# Patient Record
Sex: Male | Born: 1985 | Race: Black or African American | Hispanic: No | Marital: Single | Smoking: Current every day smoker
Health system: Southern US, Community
[De-identification: ages and names within clinical notes are randomized; demographics above are authoritative.]

## PROBLEM LIST (undated history)

## (undated) DIAGNOSIS — K219 Gastro-esophageal reflux disease without esophagitis: Secondary | ICD-10-CM

## (undated) DIAGNOSIS — R0602 Shortness of breath: Secondary | ICD-10-CM

## (undated) DIAGNOSIS — F319 Bipolar disorder, unspecified: Secondary | ICD-10-CM

## (undated) HISTORY — PX: APPENDECTOMY: SHX54

## (undated) HISTORY — PX: MANDIBLE SURGERY: SHX707

---
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
## 2003-02-15 ENCOUNTER — Encounter: Payer: Self-pay | Admitting: Emergency Medicine

## 2003-02-15 ENCOUNTER — Emergency Department (HOSPITAL_COMMUNITY): Admission: AD | Admit: 2003-02-15 | Discharge: 2003-02-15 | Payer: Self-pay | Admitting: Emergency Medicine

## 2003-03-13 ENCOUNTER — Emergency Department (HOSPITAL_COMMUNITY): Admission: EM | Admit: 2003-03-13 | Discharge: 2003-03-13 | Payer: Self-pay | Admitting: Emergency Medicine

## 2003-03-13 ENCOUNTER — Encounter: Payer: Self-pay | Admitting: Emergency Medicine

## 2003-05-20 ENCOUNTER — Encounter: Admission: RE | Admit: 2003-05-20 | Discharge: 2003-05-20 | Payer: Self-pay | Admitting: Family Medicine

## 2003-07-31 ENCOUNTER — Encounter: Admission: RE | Admit: 2003-07-31 | Discharge: 2003-07-31 | Payer: Self-pay | Admitting: Sports Medicine

## 2003-08-05 ENCOUNTER — Encounter: Admission: RE | Admit: 2003-08-05 | Discharge: 2003-08-05 | Payer: Self-pay | Admitting: Family Medicine

## 2003-08-12 ENCOUNTER — Encounter: Admission: RE | Admit: 2003-08-12 | Discharge: 2003-08-12 | Payer: Self-pay | Admitting: Family Medicine

## 2003-08-14 ENCOUNTER — Encounter: Admission: RE | Admit: 2003-08-14 | Discharge: 2003-08-14 | Payer: Self-pay | Admitting: Sports Medicine

## 2003-08-19 ENCOUNTER — Encounter: Admission: RE | Admit: 2003-08-19 | Discharge: 2003-08-19 | Payer: Self-pay | Admitting: Family Medicine

## 2003-09-02 ENCOUNTER — Encounter: Admission: RE | Admit: 2003-09-02 | Discharge: 2003-09-02 | Payer: Self-pay | Admitting: Family Medicine

## 2003-09-29 ENCOUNTER — Encounter: Admission: RE | Admit: 2003-09-29 | Discharge: 2003-09-29 | Payer: Self-pay | Admitting: Family Medicine

## 2003-10-14 ENCOUNTER — Encounter: Admission: RE | Admit: 2003-10-14 | Discharge: 2003-10-14 | Payer: Self-pay | Admitting: Family Medicine

## 2003-10-16 ENCOUNTER — Encounter: Admission: RE | Admit: 2003-10-16 | Discharge: 2003-10-16 | Payer: Self-pay | Admitting: Sports Medicine

## 2003-11-13 ENCOUNTER — Encounter: Admission: RE | Admit: 2003-11-13 | Discharge: 2003-11-13 | Payer: Self-pay | Admitting: Sports Medicine

## 2003-12-15 ENCOUNTER — Emergency Department (HOSPITAL_COMMUNITY): Admission: EM | Admit: 2003-12-15 | Discharge: 2003-12-15 | Payer: Self-pay | Admitting: Emergency Medicine

## 2004-06-26 ENCOUNTER — Emergency Department (HOSPITAL_COMMUNITY): Admission: EM | Admit: 2004-06-26 | Discharge: 2004-06-26 | Payer: Self-pay

## 2004-07-07 ENCOUNTER — Emergency Department (HOSPITAL_COMMUNITY): Admission: EM | Admit: 2004-07-07 | Discharge: 2004-07-08 | Payer: Self-pay | Admitting: Emergency Medicine

## 2004-07-21 ENCOUNTER — Encounter (INDEPENDENT_AMBULATORY_CARE_PROVIDER_SITE_OTHER): Payer: Self-pay | Admitting: *Deleted

## 2004-07-21 ENCOUNTER — Ambulatory Visit (HOSPITAL_COMMUNITY): Admission: RE | Admit: 2004-07-21 | Discharge: 2004-07-21 | Payer: Self-pay | Admitting: General Surgery

## 2004-08-30 ENCOUNTER — Emergency Department (HOSPITAL_COMMUNITY): Admission: EM | Admit: 2004-08-30 | Discharge: 2004-08-30 | Payer: Self-pay | Admitting: Family Medicine

## 2004-10-21 ENCOUNTER — Ambulatory Visit: Payer: Self-pay | Admitting: Sports Medicine

## 2005-02-17 ENCOUNTER — Ambulatory Visit: Payer: Self-pay | Admitting: Sports Medicine

## 2005-04-06 ENCOUNTER — Ambulatory Visit: Payer: Self-pay | Admitting: Family Medicine

## 2005-05-18 ENCOUNTER — Ambulatory Visit: Payer: Self-pay | Admitting: Family Medicine

## 2005-09-07 ENCOUNTER — Emergency Department (HOSPITAL_COMMUNITY): Admission: EM | Admit: 2005-09-07 | Discharge: 2005-09-07 | Payer: Self-pay | Admitting: Emergency Medicine

## 2005-09-10 ENCOUNTER — Emergency Department (HOSPITAL_COMMUNITY): Admission: EM | Admit: 2005-09-10 | Discharge: 2005-09-10 | Payer: Self-pay | Admitting: Family Medicine

## 2005-09-22 ENCOUNTER — Ambulatory Visit: Payer: Self-pay | Admitting: Sports Medicine

## 2005-10-18 ENCOUNTER — Ambulatory Visit: Payer: Self-pay | Admitting: Family Medicine

## 2005-11-02 ENCOUNTER — Ambulatory Visit: Payer: Self-pay | Admitting: Sports Medicine

## 2005-12-19 ENCOUNTER — Emergency Department (HOSPITAL_COMMUNITY): Admission: EM | Admit: 2005-12-19 | Discharge: 2005-12-19 | Payer: Self-pay | Admitting: Emergency Medicine

## 2005-12-21 ENCOUNTER — Ambulatory Visit: Payer: Self-pay | Admitting: Sports Medicine

## 2006-03-21 ENCOUNTER — Emergency Department (HOSPITAL_COMMUNITY): Admission: EM | Admit: 2006-03-21 | Discharge: 2006-03-22 | Payer: Self-pay | Admitting: Emergency Medicine

## 2006-03-22 ENCOUNTER — Ambulatory Visit: Payer: Self-pay | Admitting: Sports Medicine

## 2006-03-29 ENCOUNTER — Ambulatory Visit: Payer: Self-pay | Admitting: Sports Medicine

## 2006-04-12 ENCOUNTER — Ambulatory Visit: Payer: Self-pay | Admitting: Sports Medicine

## 2006-04-18 ENCOUNTER — Ambulatory Visit: Payer: Self-pay | Admitting: Sports Medicine

## 2006-04-24 ENCOUNTER — Inpatient Hospital Stay (HOSPITAL_COMMUNITY): Admission: AD | Admit: 2006-04-24 | Discharge: 2006-04-25 | Payer: Self-pay | Admitting: *Deleted

## 2006-04-24 ENCOUNTER — Ambulatory Visit: Payer: Self-pay | Admitting: *Deleted

## 2006-04-27 ENCOUNTER — Ambulatory Visit: Payer: Self-pay | Admitting: Sports Medicine

## 2006-06-16 ENCOUNTER — Emergency Department (HOSPITAL_COMMUNITY): Admission: EM | Admit: 2006-06-16 | Discharge: 2006-06-16 | Payer: Self-pay | Admitting: Family Medicine

## 2006-07-14 ENCOUNTER — Emergency Department (HOSPITAL_COMMUNITY): Admission: EM | Admit: 2006-07-14 | Discharge: 2006-07-14 | Payer: Self-pay | Admitting: Emergency Medicine

## 2006-08-29 ENCOUNTER — Ambulatory Visit: Payer: Self-pay | Admitting: Family Medicine

## 2006-08-31 ENCOUNTER — Emergency Department (HOSPITAL_COMMUNITY): Admission: EM | Admit: 2006-08-31 | Discharge: 2006-08-31 | Payer: Self-pay | Admitting: Family Medicine

## 2006-09-06 ENCOUNTER — Emergency Department (HOSPITAL_COMMUNITY): Admission: EM | Admit: 2006-09-06 | Discharge: 2006-09-07 | Payer: Self-pay | Admitting: Emergency Medicine

## 2006-09-07 ENCOUNTER — Ambulatory Visit: Payer: Self-pay | Admitting: Sports Medicine

## 2006-12-24 ENCOUNTER — Emergency Department (HOSPITAL_COMMUNITY): Admission: EM | Admit: 2006-12-24 | Discharge: 2006-12-24 | Payer: Self-pay | Admitting: Emergency Medicine

## 2006-12-29 ENCOUNTER — Telehealth: Payer: Self-pay | Admitting: *Deleted

## 2007-01-01 ENCOUNTER — Ambulatory Visit: Payer: Self-pay | Admitting: Family Medicine

## 2007-01-10 ENCOUNTER — Ambulatory Visit: Payer: Self-pay | Admitting: Sports Medicine

## 2007-04-05 ENCOUNTER — Ambulatory Visit: Payer: Self-pay | Admitting: Family Medicine

## 2007-04-05 DIAGNOSIS — F319 Bipolar disorder, unspecified: Secondary | ICD-10-CM | POA: Insufficient documentation

## 2007-04-08 ENCOUNTER — Emergency Department (HOSPITAL_COMMUNITY): Admission: EM | Admit: 2007-04-08 | Discharge: 2007-04-08 | Payer: Self-pay | Admitting: Emergency Medicine

## 2007-04-12 ENCOUNTER — Ambulatory Visit: Payer: Self-pay | Admitting: Sports Medicine

## 2007-04-17 ENCOUNTER — Ambulatory Visit: Payer: Self-pay | Admitting: Sports Medicine

## 2007-04-17 ENCOUNTER — Ambulatory Visit (HOSPITAL_COMMUNITY): Admission: RE | Admit: 2007-04-17 | Discharge: 2007-04-17 | Payer: Self-pay | Admitting: Sports Medicine

## 2007-04-18 ENCOUNTER — Encounter (INDEPENDENT_AMBULATORY_CARE_PROVIDER_SITE_OTHER): Payer: Self-pay | Admitting: *Deleted

## 2007-04-25 ENCOUNTER — Ambulatory Visit: Payer: Self-pay | Admitting: Sports Medicine

## 2007-05-09 ENCOUNTER — Ambulatory Visit: Payer: Self-pay | Admitting: Sports Medicine

## 2007-05-09 ENCOUNTER — Encounter (INDEPENDENT_AMBULATORY_CARE_PROVIDER_SITE_OTHER): Payer: Self-pay | Admitting: *Deleted

## 2007-05-25 ENCOUNTER — Encounter: Payer: Self-pay | Admitting: Sports Medicine

## 2007-06-08 ENCOUNTER — Emergency Department (HOSPITAL_COMMUNITY): Admission: EM | Admit: 2007-06-08 | Discharge: 2007-06-08 | Payer: Self-pay | Admitting: Emergency Medicine

## 2007-06-26 ENCOUNTER — Emergency Department (HOSPITAL_COMMUNITY): Admission: EM | Admit: 2007-06-26 | Discharge: 2007-06-27 | Payer: Self-pay | Admitting: Emergency Medicine

## 2007-08-08 ENCOUNTER — Encounter: Payer: Self-pay | Admitting: Sports Medicine

## 2007-08-30 ENCOUNTER — Encounter (INDEPENDENT_AMBULATORY_CARE_PROVIDER_SITE_OTHER): Payer: Self-pay | Admitting: *Deleted

## 2007-08-30 ENCOUNTER — Ambulatory Visit: Payer: Self-pay | Admitting: Family Medicine

## 2007-08-30 LAB — CONVERTED CEMR LAB
ALT: 23 units/L (ref 0–53)
CO2: 27 meq/L (ref 19–32)
Creatinine, Ser: 1.26 mg/dL (ref 0.40–1.50)
H Pylori IgG: NEGATIVE
Total Bilirubin: 0.6 mg/dL (ref 0.3–1.2)

## 2007-09-03 ENCOUNTER — Encounter (INDEPENDENT_AMBULATORY_CARE_PROVIDER_SITE_OTHER): Payer: Self-pay | Admitting: *Deleted

## 2007-09-04 ENCOUNTER — Ambulatory Visit (HOSPITAL_COMMUNITY): Admission: RE | Admit: 2007-09-04 | Discharge: 2007-09-04 | Payer: Self-pay | Admitting: *Deleted

## 2007-09-04 ENCOUNTER — Encounter (INDEPENDENT_AMBULATORY_CARE_PROVIDER_SITE_OTHER): Payer: Self-pay | Admitting: *Deleted

## 2007-09-07 ENCOUNTER — Emergency Department (HOSPITAL_COMMUNITY): Admission: EM | Admit: 2007-09-07 | Discharge: 2007-09-07 | Payer: Self-pay | Admitting: Emergency Medicine

## 2007-09-17 ENCOUNTER — Telehealth: Payer: Self-pay | Admitting: *Deleted

## 2007-09-20 ENCOUNTER — Encounter: Payer: Self-pay | Admitting: *Deleted

## 2007-09-28 ENCOUNTER — Emergency Department (HOSPITAL_COMMUNITY): Admission: EM | Admit: 2007-09-28 | Discharge: 2007-09-28 | Payer: Self-pay | Admitting: Emergency Medicine

## 2008-04-09 ENCOUNTER — Emergency Department (HOSPITAL_COMMUNITY): Admission: EM | Admit: 2008-04-09 | Discharge: 2008-04-10 | Payer: Self-pay | Admitting: Emergency Medicine

## 2008-04-11 ENCOUNTER — Telehealth (INDEPENDENT_AMBULATORY_CARE_PROVIDER_SITE_OTHER): Payer: Self-pay | Admitting: *Deleted

## 2008-04-12 ENCOUNTER — Emergency Department (HOSPITAL_COMMUNITY): Admission: EM | Admit: 2008-04-12 | Discharge: 2008-04-12 | Payer: Self-pay | Admitting: Emergency Medicine

## 2008-04-14 ENCOUNTER — Ambulatory Visit: Payer: Self-pay | Admitting: Family Medicine

## 2008-08-04 ENCOUNTER — Ambulatory Visit: Payer: Self-pay | Admitting: Family Medicine

## 2008-08-04 ENCOUNTER — Telehealth: Payer: Self-pay | Admitting: *Deleted

## 2008-08-10 ENCOUNTER — Emergency Department (HOSPITAL_COMMUNITY): Admission: EM | Admit: 2008-08-10 | Discharge: 2008-08-10 | Payer: Self-pay | Admitting: Emergency Medicine

## 2008-08-19 ENCOUNTER — Telehealth: Payer: Self-pay | Admitting: Family Medicine
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
## 2009-05-09 ENCOUNTER — Encounter (INDEPENDENT_AMBULATORY_CARE_PROVIDER_SITE_OTHER): Payer: Self-pay | Admitting: *Deleted

## 2009-05-09 DIAGNOSIS — F17200 Nicotine dependence, unspecified, uncomplicated: Secondary | ICD-10-CM

## 2009-08-31 ENCOUNTER — Emergency Department (HOSPITAL_COMMUNITY): Admission: EM | Admit: 2009-08-31 | Discharge: 2009-09-01 | Payer: Self-pay | Admitting: Emergency Medicine

## 2009-08-31 ENCOUNTER — Emergency Department (HOSPITAL_COMMUNITY): Admission: EM | Admit: 2009-08-31 | Discharge: 2009-08-31 | Payer: Self-pay | Admitting: Emergency Medicine

## 2009-09-01 ENCOUNTER — Ambulatory Visit: Payer: Self-pay | Admitting: Family Medicine

## 2009-09-13 ENCOUNTER — Emergency Department (HOSPITAL_COMMUNITY): Admission: EM | Admit: 2009-09-13 | Discharge: 2009-09-14 | Payer: Self-pay | Admitting: Emergency Medicine

## 2010-01-25 ENCOUNTER — Telehealth: Payer: Self-pay | Admitting: Family Medicine

## 2010-01-26 ENCOUNTER — Ambulatory Visit: Payer: Self-pay | Admitting: Family Medicine

## 2010-02-05 ENCOUNTER — Ambulatory Visit: Payer: Self-pay | Admitting: Family Medicine

## 2010-02-09 ENCOUNTER — Telehealth: Payer: Self-pay | Admitting: *Deleted

## 2010-04-15 ENCOUNTER — Telehealth: Payer: Self-pay | Admitting: Family Medicine

## 2010-04-16 ENCOUNTER — Ambulatory Visit: Payer: Self-pay | Admitting: Family Medicine

## 2010-04-16 DIAGNOSIS — M545 Low back pain: Secondary | ICD-10-CM

## 2010-04-19 ENCOUNTER — Encounter: Payer: Self-pay | Admitting: Family Medicine

## 2010-04-19 DIAGNOSIS — J45909 Unspecified asthma, uncomplicated: Secondary | ICD-10-CM | POA: Insufficient documentation

## 2010-06-01 ENCOUNTER — Ambulatory Visit: Payer: Self-pay | Admitting: Family Medicine

## 2010-07-08 ENCOUNTER — Emergency Department (HOSPITAL_COMMUNITY): Admission: EM | Admit: 2010-07-08 | Discharge: 2010-01-24 | Payer: Self-pay | Admitting: Emergency Medicine

## 2010-08-11 ENCOUNTER — Emergency Department (HOSPITAL_COMMUNITY)
Admission: EM | Admit: 2010-08-11 | Discharge: 2010-08-11 | Payer: Self-pay | Source: Home / Self Care | Admitting: Emergency Medicine

## 2010-08-13 ENCOUNTER — Ambulatory Visit
Admission: RE | Admit: 2010-08-13 | Discharge: 2010-08-13 | Payer: Self-pay | Source: Home / Self Care | Attending: Family Medicine | Admitting: Family Medicine

## 2010-08-13 DIAGNOSIS — S93409A Sprain of unspecified ligament of unspecified ankle, initial encounter: Secondary | ICD-10-CM | POA: Insufficient documentation

## 2010-08-22 ENCOUNTER — Encounter: Payer: Self-pay | Admitting: Sports Medicine

## 2010-08-31 NOTE — Assessment & Plan Note (Signed)
Summary: would not say why he needs to be seen/[facility]   Vital Signs:  Patient profile:   25 year old male Weight:      221 pounds Pulse rate:   84 / minute BP sitting:   137 / 90  (left arm) Cuff size:   large  Vitals Entered By: Arlyss Repress CMA, (April 16, 2010 9:39 AM) CC: back pain. tight and muscle spasms x 2 days. received std tx at the HD yesterday. ? rocephin shot? one sexual partner was dx with std at the HD and he went there for tx Is Patient Diabetic? No Pain Assessment Patient in pain? yes     Location: lower back Intensity: 10 Onset of pain  x2d   Primary Care Provider:  Enid Baas MD  CC:  back pain. tight and muscle spasms x 2 days. received std tx at the HD yesterday. ? rocephin shot? one sexual partner was dx with std at the HD and he went there for tx.  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 Yesterday became severe and unable to sleep.  He was also upset yesterday because a young lady who he was having sex with called and told him she had GC. He was treated at the HD for all STDs and HIV obtained.  Habits & Providers  Alcohol-Tobacco-Diet     Tobacco Status: current     Tobacco Counseling: to remain off tobacco products     Cigarette Packs/Day: 0.5  Comments: pt has interest in quitting  Current Medications (verified): 1)  Diazepam 5 Mg Tabs (Diazepam) .... One Two Times A Day As Needed 2)  Ventolin Hfa 108 (90 Base) Mcg/act Aers (Albuterol Sulfate) .... Two Puffs Q 4 Hours As Needed For Cough and Wheeze 3)  Hydrocodone-Acetaminophen 5-500 Mg Tabs (Hydrocodone-Acetaminophen) .... One Tab Three Times A Day As Needed Pain 4)  Omeprazole 40 Mg Cpdr (Omeprazole) .... One Daily in Morning Before Eating On An Empty Stomach. 5)  Diclofenac Sodium 75 Mg Tbec (Diclofenac Sodium) .... Two Times A Day For 10 Days  Allergies (verified): 1)  ! Ibuprofen (Ibuprofen)  Social History: Packs/Day:   0.5  Review of Systems General:  Denies chills and fever. MS:  Complains of low back pain.  Physical Exam  General:  Well-developed,well-nourished,in no acute distress; alert,appropriate and cooperative throughout examination Msk:  flat lumbar lordosis, tender along the lumbar paravetebral muscles, good extension, pain with fexion.  LE strenght 5/5 with toe and heel walking.  Neurologic:   LE DTRs +2 symmetrical Psych:  normally interactive.     Impression & Recommendations:  Problem # 1:  LOW BACK PAIN, ACUTE (ICD-724.2) Short course of pain meds, low back exercises and discussion regarding body mechanics. His updated medication list for this problem includes:    Hydrocodone-acetaminophen 5-500 Mg Tabs (Hydrocodone-acetaminophen) ..... One tab three times a day as needed pain    Diclofenac Sodium 75 Mg Tbec (Diclofenac sodium) .Marland Kitchen..Marland Kitchen Two times a day for 10 days  Orders: [facility]- Est Level  3 (09811)  Complete Medication List: 1)  Diazepam 5 Mg Tabs (Diazepam) .... One two times a day as needed 2)  Ventolin Hfa 108 (90 Base) Mcg/act Aers (Albuterol sulfate) .... Two puffs q 4 hours as needed for cough and wheeze 3)  Hydrocodone-acetaminophen 5-500 Mg Tabs (Hydrocodone-acetaminophen) .... One tab three times a day as needed pain 4)  Omeprazole 40 Mg Cpdr (Omeprazole) .... One daily  in morning before eating on an empty stomach. 5)  Diclofenac Sodium 75 Mg Tbec (Diclofenac sodium) .... Two times a day for 10 days  Patient Instructions: 1)  If you could be exposed to sexually transmitted diseases. you should use a condom.  2)  So back exercises daily Prescriptions: DICLOFENAC SODIUM 75 MG TBEC (DICLOFENAC SODIUM) two times a day for 10 days Brand medically necessary #20 x 0   Entered and Authorized by:   Luretha Murphy NP   Signed by:   Luretha Murphy NP on 04/16/2010   Method used:   Print then Give to Patient   RxID:   [id number] HYDROCODONE-ACETAMINOPHEN 5-500 MG TABS  (HYDROCODONE-ACETAMINOPHEN) one tab three times a day as needed pain Brand medically necessary #30 x 0   Entered and Authorized by:   Luretha Murphy NP   Signed by:   Luretha Murphy NP on 04/16/2010   Method used:   Print then Give to Patient   RxID:   [id number] DIAZEPAM 5 MG TABS (DIAZEPAM) one two times a day as needed Brand medically necessary #60 x 0   Entered and Authorized by:   Luretha Murphy NP   Signed by:   Luretha Murphy NP on 04/16/2010   Method used:   Print then Give to Patient   RxID:   [id number] DICLOFENAC SODIUM 75 MG TBEC (DICLOFENAC SODIUM) two times a day for 10 days Brand medically necessary #20 x 0   Entered and Authorized by:   Luretha Murphy NP   Signed by:   Luretha Murphy NP on 04/16/2010   Method used:   Print then Give to Patient   RxID:   [id number] HYDROCODONE-ACETAMINOPHEN 5-500 MG TABS (HYDROCODONE-ACETAMINOPHEN) one tab three times a day as needed pain Brand medically necessary #30 x 0   Entered and Authorized by:   Luretha Murphy NP   Signed by:   Luretha Murphy NP on 04/16/2010   Method used:   Print then Give to Patient   RxID:   [id number] DIAZEPAM 5 MG TABS (DIAZEPAM) one two times a day as needed Brand medically necessary #60 x 0   Entered and Authorized by:   Luretha Murphy NP   Signed by:   Luretha Murphy NP on 04/16/2010   Method used:   Print then Give to Patient   RxID:   [id number] DICLOFENAC SODIUM 75 MG TBEC (DICLOFENAC SODIUM) two times a day for 10 days Brand medically necessary #20 x 0   Entered and Authorized by:   Luretha Murphy NP   Signed by:   Luretha Murphy NP on 04/16/2010   Method used:   Print then Give to Patient   RxID:   [id number] DIAZEPAM 5 MG TABS (DIAZEPAM) one two times a day as needed Brand medically necessary #60 x 0   Entered and Authorized by:   Luretha Murphy NP   Signed by:   Luretha Murphy NP on 04/16/2010   Method used:   Print then Give to Patient   RxID:   [id number] HYDROCODONE-ACETAMINOPHEN 5-500 MG  TABS (HYDROCODONE-ACETAMINOPHEN) one tab three times a day as needed pain Brand medically necessary #30 x 0   Entered and Authorized by:   Luretha Murphy NP   Signed by:   Luretha Murphy NP on 04/16/2010   Method used:   Print then Give to Patient   RxID:   [id number]

## 2010-08-31 NOTE — Progress Notes (Signed)
       Additional Follow-up for Phone Call Additional follow up Details #2::    he called md line & LM. he wants to see his pcp only & would prefer to speak with her only. appt made for tomorrow Follow-up by: Golden Circle RN,  April 15, 2010 11:26 AM

## 2010-08-31 NOTE — Progress Notes (Signed)
Summary: Lost Rx  Phone Note Call from Patient Call back at [phone]   Reason for Call: Talk to Doctor Summary of Call: pt was here 7/8, has not filled meds yet bc he didn't have the money, he lost the Rxs wants to know if we can replace them? Initial call taken by: Knox Royalty,  February 09, 2010 3:36 PM  Follow-up for Phone Call        called pt. pt found his rx's. Follow-up by: Arlyss Repress CMA,,  February 09, 2010 3:43 PM
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
## 2010-08-31 NOTE — Progress Notes (Signed)
Summary: triage  Phone Note Call from Patient Call back at [phone]   Caller: Patient Summary of Call: Hurt ankle over weekend and wants to be seen today. Initial call taken by: Clydell Hakim,  January 25, 2010 8:51 AM  Follow-up for Phone Call        LM Follow-up by: Golden Circle RN,  January 25, 2010 9:06 AM  Additional Follow-up for Phone Call Additional follow up Details #1::        spoke with a woman who wanted to make the appt for him as he was not there. states he twisted his ankle badly & they went to ED. was told to f/u here. appt tomorrow at 10am with Dr. Claudius Sis Additional Follow-up by: Golden Circle RN,  January 25, 2010 9:37 AM

## 2010-08-31 NOTE — Assessment & Plan Note (Signed)
Summary: f/u ankle injury/[name]/saxon   Vital Signs:  Patient profile:   25 year old male Weight:      226 pounds Temp:     98.6 degrees F Pulse rate:   74 / minute BP sitting:   125 / 68  Vitals Entered By: Jone Baseman CMA (January 26, 2010 10:27 AM) CC: f/u ankle Is Patient Diabetic? No Pain Assessment Patient in pain? yes     Location: keft ankle Intensity: 6   Primary Care Provider:  Enid Baas MD  CC:  f/u ankle.  History of Present Illness: Patient was recently seen at [facility] on 6/26 for c/o twisting his L ankle 3 days ago. Xray was negative. He was walking in his yard and stepped in a hole and inverted his ankle.  He was given an Rx for Vicodin but states it is not helping.  He has been wearing a brace on it and walking with a crutch.  The bruising is minimal, mostly confined to lateral foot, inferior to lateral malleolus.  There is no point tenderness, he has full ROM and can bear weight.  He also c/o mild R ankle pain from a previous injury.  However, he still stays active and plays basketball regularly.      Habits & Providers  Alcohol-Tobacco-Diet     Tobacco Status: current     Tobacco Counseling: to quit use of tobacco products     Cigarette Packs/Day: 0.75  Allergies: No Known Drug Allergies  Social History: Packs/Day:  0.75  Physical Exam  General:  Well-developed,well-nourished,in no acute distress; alert,appropriate and cooperative throughout examination Extremities:  L ankle with swelling and ecchymosis (lateral heel), mostly surrounding lateral malleolus.  Full ROM, weight bearing, no bony point tenderness. Passive inversion and plantar flexion reproduces pain. No apparent ligamentous instability. DP and PT pulses euqal bilaterally.    Impression & Recommendations:  Problem # 1:  ANKLE SPRAIN, LEFT (ICD-845.00) Vicodin not helping.  Will give short course of Percocet.  Patient allergic to Ibuprofen.  Continue RICE treatment (written info provided).   If no improvement in 1 week - make an appointment with Sports Medicine clinic.   His updated medication list for this problem includes:    Percocet 5-325 Mg Tabs (Oxycodone-acetaminophen) .Marland Kitchen..Marland Kitchen Two tabs by mouth q4 hours as needed for pain  Orders: [facility]- Est Level  3 (04540)  Complete Medication List: 1)  Diazepam 5 Mg Tabs (Diazepam) .... One two times a day as needed 2)  Flonase 50 Mcg/act Susp (Fluticasone propionate) .... Two squirts in each nostril daily 3)  Ventolin Hfa 108 (90 Base) Mcg/act Aers (Albuterol sulfate) .... Two puffs q 4 hours as needed for cough and wheeze 4)  Percocet 5-325 Mg Tabs (Oxycodone-acetaminophen) .... Two tabs by mouth q4 hours as needed for pain Prescriptions: PERCOCET 5-325 MG TABS (OXYCODONE-ACETAMINOPHEN) two tabs by mouth q4 hours as needed for pain  #30 x 0   Entered and Authorized by:   Jettie Pagan MD   Signed by:   Jettie Pagan MD on 01/26/2010   Method used:   Print then Give to Patient   RxID:   [id number]

## 2010-08-31 NOTE — Assessment & Plan Note (Signed)
Summary: back pain/ha/kh   Vital Signs:  Patient profile:   25 year old male Weight:      230.8 pounds Pulse rate:   88 / minute BP sitting:   137 / 90  (left arm) Cuff size:   large  Vitals Entered By: Arlyss Repress CMA, (June 01, 2010 8:40 AM) CC: f/up back pain. refill valium. Is Patient Diabetic? No Pain Assessment Patient in pain? yes     Location: lower back Intensity: 7 Onset of pain  x 1 week   Primary Care Provider:  Enid Baas MD  CC:  f/up back pain. refill valium.Marland Kitchen  History of Present Illness: Peter Bailey is here with his grandmother.  He fell when trying grab is daugther and re-injured his back.  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  His family is encouraging him to go to school as he is good in Optician, dispensing.  Never got his diazepam script filled per grandmother, she found the script at home.  It cost $14 at [facility] and does not have money.  This helps his anger, he has an apt at the [facility] in December.  Has been on and off a lot of med, diazepam seems to calm him down the best.  Lower back pain with spasms, he says that the knows yoga, encouraged him to practice to control chronic back problems.  Habits & Providers  Alcohol-Tobacco-Diet     Tobacco Status: current     Tobacco Counseling: to quit use of tobacco products     Cigarette Packs/Day: 0.25  Current Medications (verified): 1)  Diazepam 5 Mg Tabs (Diazepam) .... One Three Times A Day As Needed 2)  Ventolin Hfa 108 (90 Base) Mcg/act Aers (Albuterol Sulfate) .... Two Puffs Q 4 Hours As Needed For Cough and Wheeze 3)  Hydrocodone-Acetaminophen 5-500 Mg Tabs (Hydrocodone-Acetaminophen) .... One Tab Three Times A Day As Needed Pain 4)  Omeprazole 40 Mg Cpdr (Omeprazole) .... One Daily in Morning Before Eating On An Empty Stomach.  Allergies (verified): 1)  ! Ibuprofen (Ibuprofen)  Social History: Packs/Day:  0.25  Review of Systems      See HPI MS:  Complains of low back pain;  denies muscle weakness.  Physical Exam  General:  Well-developed,well-nourished,in no acute distress; alert,appropriate and cooperative throughout examination Chest Wall:  tender along rib margins on the right with puncture site healing. Lungs:  normal respiratory effort, normal breath sounds, and no wheezes.   Heart:  normal rate and regular rhythm.   Msk:  flat lumbar lordosis, walks with buttocks behind him.  Good ROM of lumbar spine but complains of pain.  LE 5/5, excellent muscle tone, able to walk on toes and heels.   Impression & Recommendations:  Problem # 1:  LOW BACK PAIN, ACUTE (ICD-724.2)  appears to reinjure himself over and over, recommended doing yoga and walking with more of a pelvic tilt.  Refilled hydrocodone for one per day. The following medications were removed from the medication list:    Diclofenac Sodium 75 Mg Tbec (Diclofenac sodium) .Marland Kitchen..Marland Kitchen Two times a day for 10 days His updated medication list for this problem includes:    Hydrocodone-acetaminophen 5-500 Mg Tabs (Hydrocodone-acetaminophen) ..... One tab three times a day as needed pain  Orders: [facility]- Est Level  3 (04540)  Problem # 2:  DISORDER, BIPOLAR NOS (ICD-296.80)  [facility], never followed up on in house psych referral.  Refilled diazepam, has been prescribed for years  for anger.  Cautioned not to use with alcohol.  Gransmother took the scripts and will manage.  Orders: [facility]- Est Level  3 (99213)  Problem # 3:  ASTHMA, INTERMITTENT, MILD (ICD-493.90)  His updated medication list for this problem includes:    Ventolin Hfa 108 (90 Base) Mcg/act Aers (Albuterol sulfate) .Marland Kitchen..Marland Kitchen Two puffs q 4 hours as needed for cough and wheeze  Orders: [facility]- Est Level  3 (65784)  Complete Medication List: 1)  Diazepam 5 Mg Tabs (Diazepam) .... One three times a day as needed 2)  Ventolin Hfa 108 (90 Base) Mcg/act Aers (Albuterol sulfate) .... Two puffs q 4 hours as needed for cough and  wheeze 3)  Hydrocodone-acetaminophen 5-500 Mg Tabs (Hydrocodone-acetaminophen) .... One tab three times a day as needed pain  Other Orders: Influenza Vaccine NON MCR (69629)  Patient Instructions: 1)  Think about going back to school, time is going by 2)  Stretch your back daily 3)  Only use the diazepam when you need it for anger 4)  Hydrocodone one per day, will refill for 2 months then you have to be seen Prescriptions: HYDROCODONE-ACETAMINOPHEN 5-500 MG TABS (HYDROCODONE-ACETAMINOPHEN) one tab three times a day as needed pain Brand medically necessary #30 x 0   Entered and Authorized by:   Luretha Murphy NP   Signed by:   Luretha Murphy NP on 06/01/2010   Method used:   Print then Give to Patient   RxID:   [id number] VENTOLIN HFA 108 (90 BASE) MCG/ACT AERS (ALBUTEROL SULFATE) two puffs q 4 hours as needed for cough and wheeze Brand medically necessary #1 x 6   Entered and Authorized by:   Luretha Murphy NP   Signed by:   Luretha Murphy NP on 06/01/2010   Method used:   Print then Give to Patient   RxID:   [id number] DIAZEPAM 5 MG TABS (DIAZEPAM) one three times a day as needed Brand medically necessary #90 x 0   Entered and Authorized by:   Luretha Murphy NP   Signed by:   Luretha Murphy NP on 06/01/2010   Method used:   Print then Give to Patient   RxID:   [id number]    Orders Added: 1)  [facility]- Est Level  3 [60630] 2)  Influenza Vaccine NON MCR [00028]   Immunizations Administered:  Influenza Vaccine # 1:    Vaccine Type: Fluvax Non-MCR    Site: left deltoid    Mfr: GlaxoSmithKline    Dose: 0.5 ml    Route: IM    Given by: Arlyss Repress CMA,    Exp. Date: 01/26/2011    Lot #: [id number]    VIS given: 02/23/10 version given June 01, 2010.  Flu Vaccine Consent Questions:    Do you have a history of severe allergic reactions to this vaccine? no    Any prior history of allergic reactions to egg and/or gelatin? no    Do you have a sensitivity to the preservative  Thimersol? no    Do you have a past history of Guillan-Barre Syndrome? no    Do you currently have an acute febrile illness? no    Have you ever had a severe reaction to latex? no    Vaccine information given and explained to patient? yes   Immunizations Administered:  Influenza Vaccine # 1:    Vaccine Type: Fluvax Non-MCR    Site: left deltoid    Mfr: GlaxoSmithKline    Dose: 0.5 ml    Route:  IM    Given by: Arlyss Repress CMA,    Exp. Date: 01/26/2011    Lot #: [id number]    VIS given: 02/23/10 version given June 01, 2010.

## 2010-08-31 NOTE — Miscellaneous (Signed)
  Clinical Lists Changes  Problems: Removed problem of ASTHMA, UNSPECIFIED (ICD-493.90) Added new problem of ASTHMA, INTERMITTENT, MILD (ICD-493.90)

## 2010-08-31 NOTE — Assessment & Plan Note (Signed)
Summary: f/up from ed visit for bronchitis,tcb   Vital Signs:  Patient profile:   25 year old male Weight:      219 pounds Temp:     98 degrees F oral Pulse rate:   99 / minute BP sitting:   134 / 86  (right arm)  Vitals Entered By: Renato Battles slade,cma CC: body aches. seen at ED. diagnosed with bronchitis. c/o body aches. chills, fever, fatigue. did not have flu shot this year Is Patient Diabetic? No Pain Assessment Patient in pain? yes     Location: body Intensity: 10 Onset of pain  x 4-5 days.   Primary Care Provider:  Enid Baas MD  CC:  body aches. seen at ED. diagnosed with bronchitis. c/o body aches. chills, fever, and fatigue. did not have flu shot this year.  History of Present Illness: Went to ER last night, diagnosed with flu and asthma exacerbation.  Given narcotic injection for chest pain, and albuterol nebs.  Scripts for narcotic cough syrup, prednisone burst, and Tamiflu have not been  filled, as he did not have certification through [employer] and not enough funds.  Explained prednisone was not expensive.  Has not been using his asthma meds regualry.  Reports constant sinus drainage and congestion.  Would like Diazepam refilled, reported that Dr. Darrick Penna took him off Depakote and prescribed DIazepam for this anger and outbursts.  His finance was with him today.  Current Medications (verified): 1)  Diazepam 5 Mg Tabs (Diazepam) .... One Two Times A Day As Needed 2)  Flonase 50 Mcg/act Susp (Fluticasone Propionate) .... Two Squirts in Each Nostril Daily 3)  Ventolin Hfa 108 (90 Base) Mcg/act Aers (Albuterol Sulfate) .... Two Puffs Q 4 Hours As Needed For Cough and Wheeze  Allergies (verified): No Known Drug Allergies  Physical Exam  General:  Look uncomfortable Ears:  TM retracted Nose:  congested Lungs:  normal respiratory effort and normal breath sounds.   Heart:  normal rate and regular rhythm.     Impression & Recommendations:  Problem # 1:   INFLUENZA (ICD-487.8)  Discussed pros and cons of Tamiflu  Orders: [facility]- Est Level  3 (16109)  Problem # 2:  DISORDER, BIPOLAR NOS (ICD-296.80)  Refilled Diazepam, he is to follow up in 3 months to discuss efficacy and possiblity of adding a medication that is not a controlled substance  Orders: [facility]- Est Level  3 (60454)  Problem # 3:  ASTHMA, UNSPECIFIED (ICD-493.90) Refilled inhalled beta agoinist, gave IM steroid as he had not had prednisone filled. His updated medication list for this problem includes:    Ventolin Hfa 108 (90 Base) Mcg/act Aers (Albuterol sulfate) .Marland Kitchen..Marland Kitchen Two puffs q 4 hours as needed for cough and wheeze  Orders: Solumedrol up to 40mg  (U9811) Albuterol Sulfate Sol 1mg  unit dose (B1478) [facility]- Est Level  3 (29562)  Complete Medication List: 1)  Diazepam 5 Mg Tabs (Diazepam) .... One two times a day as needed 2)  Flonase 50 Mcg/act Susp (Fluticasone propionate) .... Two squirts in each nostril daily 3)  Ventolin Hfa 108 (90 Base) Mcg/act Aers (Albuterol sulfate) .... Two puffs q 4 hours as needed for cough and wheeze  Patient Instructions: 1)  Please schedule a follow-up appointment in 3 months .  Prescriptions: VENTOLIN HFA 108 (90 BASE) MCG/ACT AERS (ALBUTEROL SULFATE) two puffs q 4 hours as needed for cough and wheeze Brand medically necessary #1 x 6   Entered and Authorized by:   Luretha Murphy NP  Signed by:   Luretha Murphy NP on 09/01/2009   Method used:   Print then Give to Patient   RxID:   [id number] FLONASE 50 MCG/ACT SUSP (FLUTICASONE PROPIONATE) two squirts in each nostril daily Brand medically necessary #1 x 6   Entered and Authorized by:   Luretha Murphy NP   Signed by:   Luretha Murphy NP on 09/01/2009   Method used:   Print then Give to Patient   RxID:   [id number] DIAZEPAM 5 MG TABS (DIAZEPAM) one two times a day as needed Brand medically necessary #60 x 0   Entered and Authorized by:   Luretha Murphy NP   Signed by:   Luretha Murphy NP on  09/01/2009   Method used:   Print then Give to Patient   RxID:   [id number] VENTOLIN HFA 108 (90 BASE) MCG/ACT AERS (ALBUTEROL SULFATE) two puffs q 4 hours as needed for cough and wheeze Brand medically necessary #1 x 6   Entered and Authorized by:   Luretha Murphy NP   Signed by:   Luretha Murphy NP on 09/01/2009   Method used:   Print then Give to Patient   RxID:   [id number] DIAZEPAM 5 MG TABS (DIAZEPAM) one two times a day as needed Brand medically necessary #60 x 0   Entered and Authorized by:   Luretha Murphy NP   Signed by:   Luretha Murphy NP on 09/01/2009   Method used:   Print then Give to Patient   RxID:   [id number] FLONASE 50 MCG/ACT SUSP (FLUTICASONE PROPIONATE) two squirts in each nostril daily Brand medically necessary #1 x 6   Entered and Authorized by:   Luretha Murphy NP   Signed by:   Luretha Murphy NP on 09/01/2009   Method used:   Print then Give to Patient   RxID:   [id number]    Medication Administration  Injection # 1:    Medication: Solumedrol up to 40mg     Diagnosis: ASTHMA, UNSPECIFIED (ICD-493.90)    Route: IM    Site: L deltoid    Exp Date: 09/2010    Lot #: [id number]    Mfr: [employer]    Comments: 40mg /55ml given    Given by: Tessie Fass CMA (September 01, 2009 11:03 AM)  Medication # 1:    Medication: Albuterol Sulfate Sol 1mg  unit dose    Diagnosis: ASTHMA, UNSPECIFIED (ICD-493.90)    Dose: 2.5mg /56ml    Route: inhaled    Exp Date: 03/2011    Lot #: [id number]    Mfr: nephron    Comments: 2.5mg /64ml given    Patient tolerated medication without complications    Given by: Tessie Fass CMA (September 01, 2009 11:05 AM)  Orders Added: 1)  Solumedrol up to 40mg  [J2920] 2)  Albuterol Sulfate Sol 1mg  unit dose [J7613] 3)  [facility]- Est Level  3 [37628]

## 2010-09-02 NOTE — Assessment & Plan Note (Signed)
Summary: ankle injury/eo   Vital Signs:  Patient profile:   25 year old male Weight:      232.56 pounds Temp:     98.1 degrees F Pulse rate:   80 / minute BP sitting:   120 / 88  Vitals Entered By: Jone Baseman CMA (August 13, 2010 3:01 PM) CC: right ankle injury Is Patient Diabetic? No Pain Assessment Patient in pain? yes     Location: right ankle Intensity: 7   Primary Care Provider:  Enid Baas MD  CC:  right ankle injury.  History of Present Illness: Sprained right ankle again, jumped up and stepped on something on the floor and rolled the ankle.Peter Bailey  Has the flu like symptoms for 4 days, he went to the ER and was offered Tamiflu but he cannot afford.  He did have a flu shot this year.  It has activated his asthma and has been using his inahller often.  He has not smoked since he has been sick.  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  He is content with this.  Habits & Providers  Alcohol-Tobacco-Diet     Tobacco Status: current     Tobacco Counseling: to quit use of tobacco products     Cigarette Packs/Day: 0.25     Year Quit: 2 weeks ago  Current Medications (verified): 1)  Diazepam 5 Mg Tabs (Diazepam) .... One Three Times A Day As Needed 2)  Ventolin Hfa 108 (90 Base) Mcg/act Aers (Albuterol Sulfate) .... Two Puffs Q 4 Hours As Needed For Cough and Wheeze 3)  Hydrocodone-Acetaminophen 5-500 Mg Tabs (Hydrocodone-Acetaminophen) .... One Tab Three Times A Day As Needed Pain 4)  Amoxicillin 500 Mg Tabs (Amoxicillin) .... One Three Times A Day For 7 Days  Allergies (verified): 1)  ! Ibuprofen (Ibuprofen)  Review of Systems General:  Denies chills, fever, and sweats. ENT:  Complains of earache, nasal congestion, postnasal drainage, and sinus pressure. CV:  Denies chest pain or discomfort. Resp:  Complains of cough, sputum productive, and wheezing. MS:  right ankle swelling, pain, and decreased ROM.  Physical  Exam  General:  Looked miserable, with steady girlfriend. Ears:  BIlateral retractions of TM Nose:  inflammed, purulent discharge Mouth:  + post nasal discharge Lungs:  Diffuse rhonchi, some consolidation of wheezing in the left upper lobe Heart:  normal rate and regular rhythm.   Msk:  right ankle with significant lateral swelling,  tender all along the medial malleolaus.  Able to weight bear, pain with ROM.   Impression & Recommendations:  Problem # 1:  ANKLE SPRAIN, RIGHT (ICD-845.00) RICE, he has done this before.  He has an ASO brace that he will wear until he feels strong, he will begin ABC exercises in a few day. His updated medication list for this problem includes:    Hydrocodone-acetaminophen 5-500 Mg Tabs (Hydrocodone-acetaminophen) ..... One tab three times a day as needed pain  Orders: [facility]- Est Level  3 (78295)  Problem # 2:  COUGH (ICD-786.2) Gave script for amox, encouraged him not to fill unless he still has symptoms at 7-10 days from the onset.  He was agreeable. Orders: [facility]- Est Level  3 (62130)  Problem # 3:  ASTHMA, INTERMITTENT, MILD (ICD-493.90) Encouraged more frequent use of MDI. His updated medication list for this problem includes:    Ventolin Hfa 108 (90 Base) Mcg/act Aers (Albuterol sulfate) .Peter Bailey..Peter Bailey Two puffs q 4 hours as needed for cough and  wheeze  Orders: [facility]- Est Level  3 (04540)  Complete Medication List: 1)  Diazepam 5 Mg Tabs (Diazepam) .... One three times a day as needed 2)  Ventolin Hfa 108 (90 Base) Mcg/act Aers (Albuterol sulfate) .... Two puffs q 4 hours as needed for cough and wheeze 3)  Hydrocodone-acetaminophen 5-500 Mg Tabs (Hydrocodone-acetaminophen) .... One tab three times a day as needed pain 4)  Amoxicillin 500 Mg Tabs (Amoxicillin) .... One three times a day for 7 days  Patient Instructions: 1)  Rest/brace ankle 2)  Elevate, and ice= RICE 3)  In a few day begin the ABC exercises 4)  hydrocodone will help cough and pain 5)   only use the Amoxicillin if you are sick for 7 days and getting worse Prescriptions: AMOXICILLIN 500 MG TABS (AMOXICILLIN) one three times a day for 7 days Brand medically necessary #21 x 0   Entered and Authorized by:   Luretha Murphy NP   Signed by:   Luretha Murphy NP on 08/13/2010   Method used:   Print then Give to Patient   RxID:   [id number] HYDROCODONE-ACETAMINOPHEN 5-500 MG TABS (HYDROCODONE-ACETAMINOPHEN) one tab three times a day as needed pain Brand medically necessary #30 x 0   Entered and Authorized by:   Luretha Murphy NP   Signed by:   Luretha Murphy NP on 08/13/2010   Method used:   Print then Give to Patient   RxID:   [id number] DIAZEPAM 5 MG TABS (DIAZEPAM) one three times a day as needed Brand medically necessary #90 x 0   Entered and Authorized by:   Luretha Murphy NP   Signed by:   Luretha Murphy NP on 08/13/2010   Method used:   Print then Give to Patient   RxID:   [id number]    Orders Added: 1)  [facility]- Est Level  3 [40347]

## 2010-09-08 ENCOUNTER — Encounter: Payer: Self-pay | Admitting: *Deleted

## 2010-09-26 ENCOUNTER — Emergency Department (HOSPITAL_COMMUNITY)
Admission: EM | Admit: 2010-09-26 | Discharge: 2010-09-26 | Disposition: A | Discharge status: Home / Self Care | Payer: Self-pay | Attending: Emergency Medicine | Admitting: Emergency Medicine

## 2010-09-26 DIAGNOSIS — X500XXA Overexertion from strenuous movement or load, initial encounter: Secondary | ICD-10-CM | POA: Insufficient documentation

## 2010-09-26 DIAGNOSIS — Y9367 Activity, basketball: Secondary | ICD-10-CM | POA: Insufficient documentation

## 2010-09-26 DIAGNOSIS — M545 Low back pain, unspecified: Secondary | ICD-10-CM | POA: Insufficient documentation

## 2010-09-26 DIAGNOSIS — Y9239 Other specified sports and athletic area as the place of occurrence of the external cause: Secondary | ICD-10-CM | POA: Insufficient documentation

## 2010-09-26 DIAGNOSIS — T148XXA Other injury of unspecified body region, initial encounter: Secondary | ICD-10-CM | POA: Insufficient documentation

## 2010-10-01 ENCOUNTER — Ambulatory Visit (INDEPENDENT_AMBULATORY_CARE_PROVIDER_SITE_OTHER): Payer: Self-pay | Admitting: Family Medicine

## 2010-10-01 ENCOUNTER — Encounter: Payer: Self-pay | Admitting: Family Medicine

## 2010-10-01 VITALS — BP 137/90 | HR 94 | Temp 98.9°F | Ht 72.0 in | Wt 249.0 lb

## 2010-10-01 DIAGNOSIS — F319 Bipolar disorder, unspecified: Secondary | ICD-10-CM

## 2010-10-01 DIAGNOSIS — M545 Low back pain: Secondary | ICD-10-CM

## 2010-10-01 MED ORDER — DIAZEPAM 5 MG PO TABS: 5.0000 mg | ORAL_TABLET | Freq: Three times a day (TID) | ORAL | Status: DC | PRN

## 2010-10-01 MED ORDER — HYDROCODONE-ACETAMINOPHEN 5-500 MG PO TABS: 1.0000 | ORAL_TABLET | Freq: Three times a day (TID) | ORAL | Status: DC | PRN

## 2010-10-01 MED ORDER — HYDROCODONE-ACETAMINOPHEN 5-500 MG PO TABS: 1.0000 | ORAL_TABLET | Freq: Every day | ORAL | Status: DC

## 2010-10-01 MED ORDER — TRAMADOL HCL 50 MG PO TABS: ORAL_TABLET | ORAL | Status: DC

## 2010-10-01 MED ORDER — CYCLOBENZAPRINE HCL 10 MG PO TABS: 10.0000 mg | ORAL_TABLET | Freq: Three times a day (TID) | ORAL | Status: DC | PRN

## 2010-10-01 NOTE — Patient Instructions (Addendum)
Think about how you sleep make changes Pelvis forward and tail tucked Camel and Cat, yoga Physical therapy will call you [facility] will tramadol and the flexeril sent electronically You have copies of the diazepam and hydrocodone Use the hydrocodone at night and the tramadol and Flexeril during the day. Drink a lot of water

## 2010-10-01 NOTE — Progress Notes (Signed)
  Subjective:    Patient ID: Peter Bailey, male    DOB: Mar 19, 1986, 25 y.o.   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  Went to play basketball On 2/28 and after shooting several times, back locked up on him and he went immediately to the ER.  He states that he is very frustrated.  The hydrocodone helps some but makes him sleepy and not wanting to do much.  The cyclobenzaprine is most helpful.  He sleeps on his stomach and often wakes up with pain. He wants to know why this is happening to him and why.    Review of Systems  Constitutional: Positive for activity change. Negative for fever and fatigue.       Laying around due to back pain and medications effect  Respiratory: Negative for wheezing.   Musculoskeletal: Positive for back pain. Negative for myalgias and gait problem.  Neurological: Negative for numbness.  Psychiatric/Behavioral: Positive for dysphoric mood. Negative for agitation.       Objective:   Physical Exam  Constitutional: He appears well-developed and well-nourished.  Musculoskeletal:       Flat lumbar lordosis, buttocks out position, no scoliosis, pain with extension, no pain with flexion, 5/5 muscle strength.  Skin: Skin is warm.          Assessment & Plan:

## 2010-10-01 NOTE — Assessment & Plan Note (Signed)
Has used diazepam for years to control his anger.

## 2010-10-01 NOTE — Assessment & Plan Note (Signed)
Recurrent problem, based on symptoms and spinal morphology suspect facet pathology and muscle spasms.  LS spine films, physical therapy. Discuss concern for long term narcotics, added tramadol for during the day so that he can work, may use hydrocodone at night, cyclobenzaprine does not make him sleepy so can use during the day, diazepam has been a chronic medication and can use at bedtime and during anger bursts.  Counseled that the answer to his problems are not in taking meds, but in rehab, exercise, and change in some old habits.

## 2010-10-17 LAB — D-DIMER, QUANTITATIVE: D-Dimer, Quant: 0.29 ug/mL-FEU (ref 0.00–0.48)

## 2010-10-29 ENCOUNTER — Emergency Department (HOSPITAL_COMMUNITY)
Admission: EM | Admit: 2010-10-29 | Discharge: 2010-10-29 | Disposition: A | Discharge status: Home / Self Care | Payer: Self-pay | Attending: Emergency Medicine | Admitting: Emergency Medicine

## 2010-10-29 DIAGNOSIS — L500 Allergic urticaria: Secondary | ICD-10-CM | POA: Insufficient documentation

## 2010-10-29 DIAGNOSIS — X58XXXA Exposure to other specified factors, initial encounter: Secondary | ICD-10-CM | POA: Insufficient documentation

## 2010-10-29 DIAGNOSIS — T7840XA Allergy, unspecified, initial encounter: Secondary | ICD-10-CM | POA: Insufficient documentation

## 2010-12-17 NOTE — Op Note (Signed)
[name], [name]              ACCOUNT NO.:  [id number]   MEDICAL RECORD NO.:  [id number]          PATIENT TYPE:  AMB   LOCATION:  DAY                          FACILITY:  [facility]   PHYSICIAN:  Ollen Gross. Vernell Morgans, M.D. DATE OF BIRTH:  1986/01/07   DATE OF PROCEDURE:  07/21/2004  DATE OF DISCHARGE:                                 OPERATIVE REPORT   PREOPERATIVE DIAGNOSES:  Anal condyloma.   POSTOPERATIVE DIAGNOSES:  Anal condyloma.   PROCEDURE:  Destruction of anal condyloma.   SURGEON:  Ollen Gross. Carolynne Edouard, M.D.   ANESTHESIA:  General via LMA.   DESCRIPTION OF PROCEDURE:  After informed consent was obtained, the patient  was brought to the operating room, placed in the supine position on the  operating room table and after adequate induction of general anesthesia, the  patient was placed in lithotomy position and his perirectal area was prepped  with Betadine, draped in the usual sterile manner. The condyloma was present  circumferentially around the rectum.  Each tuft of condyloma was attached  via a stalk and the stalk of each segment of condyloma was excised sharply  with the electrocautery and sent to pathology for evaluation. Once this was  accomplished, the perirectal area looked good. Some smaller areas just on  the skin of condyloma were fulgurated with the electrocautery.  The entire  area was then infiltrated with 0.25% Marcaine with epinephrine. The wounds  appeared to be hemostatic and the rectum looked much better without the  condyloma there.  The wounds were then dressed with dibucaine ointment and  sterile dressings. The patient tolerated the procedure well. At the end of  the case, all sponge, needle and instrument counts were correct. The patient  was then awakened and taken to the recovery room in stable condition.     Renae Fickle   PST/MEDQ  D:  07/22/2004  T:  07/22/2004  Job:  [id number]

## 2010-12-17 NOTE — Discharge Summary (Signed)
[place]. [facility]  Patient:    Peter, Bailey Visit Number: [id number] MRN: [id number]          Service Type: MED Location: PEDS 6126 01 Attending Physician:  Barbee Cough Dictated by:   Kinnie Scales. Annalee Genta, M.D. Adm. Date:  03/23/2001 Disc. Date: 03/24/2001   CC:         [facility]   Discharge Summary  ADMISSION DIAGNOSIS:  Right facial stab wound with buccal space hematoma.  DISCHARGE DIAGNOSIS:  Right facial stab wound with buccal space hematoma.  PROCEDURES:  Status post exploration, ligation of bleeding blood vessels, and evacuation of hematoma with closure of facial stab wound, performed on March 23, 2001, under general anesthesia at [facility].  DISPOSITION/CONDITION ON DISCHARGE:  The patient is discharged to home in stable condition, in the company of his family.  DISCHARGE MEDICATIONS: 1. Augmentin 500 mg p.o. b.i.d. for 10 days. 2. Lortab 5/500 1-2 p.o. q.4-6h. p.r.n. pain, dispense 30 without refills.  ACTIVITY:  Limited, no lifting or straining.  DIET:  Soft, as tolerated.  WOUND CARE:  Half-strength hydrogen peroxide and bacitracin ointment applied to laceration on a twice-daily basis.  The patient will apply an ice compress and will sleep with the head of bed elevated.  FOLLOW-UP:  The patient will follow up in my office in 10 days for reevaluation of suture removal, or sooner if warranted.  His family is to call for an appointment at [phone].  HISTORY OF PRESENT ILLNESS:  Peter Bailey is a 25 year old male who was evaluated in the [facility] Emergency Department.  According to the patient, he was playing with a knife when it slipped, and he suffered a stab wound in the right cheek.  On initial evaluation by the ER staff, the wound appeared to be relatively minor but, in evaluating the extent of the laceration, a large hematoma began to develop in the left buccal space.  ENT service was  consulted for evaluation and management of this problem.  Evaluation in the emergency room showed an approximately 1 cm vertically oriented laceration in the left perimandibular space.  The patient had a large firm mass within the left buccal space.  Intraoral examination revealed an expanding hematoma.  No evidence of intraoral injury.  Given the progressive nature of the patients injury, I recommended that we take him to the operating room for examination under anesthesia, evacuation of hematoma, ligation of bleeding vessels, and closure of the laceration after meticulous debridement.  The informed consent was obtained from the patients legal guardian, and he was admitted to the hospital and taken to the operating room for the above procedure.  HOSPITAL COURSE:  The patient was admitted to the ENT service under Dr. Clovis Pu care on March 23, 2001, for management of a left facial stab wound and expanding buccal hematoma.  In the operating room the patient was found to have a large hematuria, which was evacuated.  Exploration of the stab wound revealed an extensive injury extending approximately 5-6 cm into the buccal space, paralleling the mandible, and extending to the mandibular notch. There were several significant arterial bleeding sources, which were ligated. The wound was thoroughly irrigated with antibiotic solution and closed in several layers.  The patient was then admitted to recovery and from recovery to the pediatric unit for further postoperative care and evaluation.  The patient was evaluated the following morning on March 24, 2001.  The laceration was intact, without significant swelling or  erythema.  There was a small amount of edema within the left cheek, but no evidence of organized hematoma or expanding mass.  The patients pain was well controlled with Lortab 5/500.  He was tolerating a soft oral diet without difficulty and was afebrile.  The patient had normal  bowel and bladder function and was ambulating without difficulty.  He was discharged to home in stable condition, with the above discharge instructions.  The patient will follow up in my office in 10 days for reevaluation, suture removal, and further care.  His family is to call for an appointment at [phone].  If the patient has increasing swelling, discomfort, fever, evidence of expanding mass or infection, they are to contact our office immediately.  They understand and agree with our discharge planning and will follow up as above. Dictated by:   Kinnie Scales. Annalee Genta, M.D. Attending Physician:  Barbee Cough DD:  03/24/01 TD:  03/25/01 Job: [id number]

## 2010-12-17 NOTE — Op Note (Signed)
[place]. [facility]  Patient:    DAILY, CRATE Visit Number: [id number] MRN: [id number]          Service Type: MED Location: PEDS 6126 01 Attending Physician:  Barbee Cough Proc. Date: 03/23/01 Adm. Date:  03/23/2001 Disc. Date: 03/24/2001                             Operative Report  PREOPERATIVE DIAGNOSIS:  Left perimandibular laceration with buccal space hematoma.  POSTOPERATIVE DIAGNOSIS:  Left perimandibular laceration with buccal space hematoma.  OPERATION PERFORMED: 1. Exploration and repair of left facial buccal laceration. 2. Evacuation of buccal space hematoma. 3. Ligation of perifacial blood vessels.  SURGEON:  Kinnie Scales. Annalee Genta, M.D.  ANESTHESIA:  General endotracheal.  COMPLICATIONS:  None.  ESTIMATED BLOOD LOSS:  Approximately 100 cc.  The patient was transferred from the operating room to the recovery room in stable condition.  INDICATIONS FOR PROCEDURE:  The patient is a 25 year old male who was admitted to the [facility] emergency room with an acute injury to the left perifacial area.  The patient reports that he was playing with a small pocketknife and accidentally stabbed himself in the left cheek.  There was a moderate amount of discomfort and significant swelling and bleeding from the laceration.  Given the extensive bleeding, he was brought to the emergency department for evaluation, evaluated by the emergency room staff.  The patient was found to have a large approximately 3 cm buccal space hematoma and profuse bleeding from the anterior aspect of perimandibular laceration.  The ENT service was consulted for evaluation and further management of the above injury.  Examination in the emergency department revealed approximately a 3 x 3 cm buccal space hematoma.  No evidence of intraoral laceration.  The patient had a small approximately 1 cm vertically oriented laceration in the left  perimandibular area.  Facial nerve function was intact.  A small amount of bleeding from the incision was noted.  Given the patients history and examination, I recommended that we undertake examination under general anesthesia with evacuation of clot, identification and ligation of bleeding vessels and closure of the laceration.  The risks and benefits and possible complications of each of the surgical procedures was discussed in detail with the patient and his legal guardian.  They understood and concurred with our plan for surgery which was scheduled on an emergent basis.  DESCRIPTION OF PROCEDURE:  The patient was taken from [facility] Emergency Department to the main operating room and general endotracheal anesthesia was established without difficulty.  When the patient was adequately anesthetized, the left perifacial area was prepped and draped in sterile fashion.  Bimanual palpation was used in order to evacuate a large hematoma through the laceration in the left perimandibular area.  The wound was then explored through the original laceration.  The depth of the laceration was approximately 5 to 6 cm and extended from the left perimandibular area through the buccal space to the mandibular notch.  There were multiple significant bleeding vessels along the tract of the laceration which necessitated application of ligature clips and suction cautery in order to control active bleeding.  With the bleeding well controlled, the patients wound was thoroughly irrigated with antibiotic saline solution.  There was no evidence of foreign body material and no additional active bleeding.  A Valsalva maneuver was performed to raise the patients blood pressure and  again no evidence of active bleeding was noted.  Approximately seven ligature clips were placed in order to maintain hemostasis.  The wound was again inspected and irrigated and there was no active bleeding.  The wound  was closed in layers with several deep sutures consisting of 4-0 Vicryl in order to reapproximate the deep subcutaneous aspect of the laceration.  The superficial subcutaneous area was then closed with 4-0 Vicryl suture in interrupted fashion.  Final closure was achieved with a 5-0 Ethilon suture in a running locked fashion.  The patient was then awakened from his anesthetic, extubated and transferred from the operating room to the recovery room in stable condition.  There were no complications.  Estimated blood loss for this procedure was approximately 100 cc. Attending Physician:  Barbee Cough DD:  03/23/01 TD:  03/26/01 Job: [id number]

## 2010-12-17 NOTE — Discharge Summary (Signed)
Peter Bailey, Peter Bailey              ACCOUNT NO.:  [id number]   MEDICAL RECORD NO.:  [id number]          PATIENT TYPE:  IPS   LOCATION:  0508                          FACILITY:  [facility]   PHYSICIAN:  Jasmine Pang, M.D. DATE OF BIRTH:  06-26-1986   DATE OF ADMISSION:  04/24/2006  DATE OF DISCHARGE:  04/25/2006                                 DISCHARGE SUMMARY   IDENTIFYING INFORMATION:  This is a 25 year old African American male who  was admitted on a voluntary basis on April 24, 2006.   HISTORY OF PRESENT ILLNESS:  The patient has a history of depression.  He  has a self-inflicted superficial injury to his left wrist that he did with a  razor.  It did not require stitches or any other intervention.  He states he  feels overwhelmed with three young children.  He is unemployed and having  financial problems.  There is possible legal problems regarding his not  making child support payments.  He was afraid he was going to have to go to  prison.  His deterrents to harming himself were his children and life.  The possible jail time for child support has now been taken care of and he  does not have this looming over him anymore.  This is the patient's first  [facility] admission.  He has had no outpatient psychiatric treatment.  He does  state he was on lithium and Depakote in the past by Dr. Darrick Penna, his [facility] physician.  He has been off this one year.  He did feel  it helped.  He denies any use of alcohol.  He does smoke.  He uses  occasional marijuana.  He denies other drug use.  For further admission  information, see psychiatric admission assessment.   PHYSICAL EXAMINATION:  The patient was healthy except for a very superficial  laceration to his left wrist.   LABORATORY DATA:  CBC was within normal limits.  CMET was within normal  limits.  TSH was 1.315.   HOSPITAL COURSE:  Upon admission, the patient began to state he was not  suicidal.  He stated he realized he  did not belong here.  He had signed  himself in originally at the suggestion of his family physician.  However,  he states he does not want to hurt himself and would be safe if he were  discharged.  His mental status had improved.  His mood was less depressed  and anxious.  Affect was wide range.  There was no suicidal or homicidal  ideation as indicated above.  No self-injurious behavior.  No auditory or  visual hallucinations.  No delusions or paranoia.  Thoughts logical and goal-  directed.  Thought content no predominant theme and the patient felt ready  for discharge and was willing to try outpatient treatment.   DISCHARGE DIAGNOSES:  AXIS I:  Mood disorder not otherwise specified.  AXIS II:  None.  AXIS III:  Asthma; otherwise healthy.  AXIS IV:  Severe (problems with primary support group, occupational problem,  financial problems, other psychosocial problems).  AXIS  V:  GAF upon discharge 55; GAF upon admission 40; GAF highest past year  65-70.   ACTIVITY/DIET:  There were no specific activity level or dietary  restrictions.   DISCHARGE MEDICATIONS:  None.  This will be determined at his appointment at  the mental health center.   POST-HOSPITAL CARE PLANS:  The patient will be seen at the [facility] on Thursday, April 27, 2006 at 12 p.m.      Jasmine Pang, M.D.  Electronically Signed     BHS/MEDQ  D:  04/25/2006  T:  04/25/2006  Job:  [id number]

## 2011-03-22 ENCOUNTER — Encounter: Payer: Self-pay | Admitting: Family Medicine

## 2011-03-22 ENCOUNTER — Ambulatory Visit (INDEPENDENT_AMBULATORY_CARE_PROVIDER_SITE_OTHER): Payer: Self-pay | Admitting: Family Medicine

## 2011-03-22 DIAGNOSIS — M545 Low back pain: Secondary | ICD-10-CM | POA: Insufficient documentation

## 2011-03-22 DIAGNOSIS — S93409A Sprain of unspecified ligament of unspecified ankle, initial encounter: Secondary | ICD-10-CM

## 2011-03-22 DIAGNOSIS — S93402A Sprain of unspecified ligament of left ankle, initial encounter: Secondary | ICD-10-CM

## 2011-03-22 MED ORDER — CYCLOBENZAPRINE HCL 10 MG PO TABS: 10.0000 mg | ORAL_TABLET | Freq: Three times a day (TID) | ORAL | Status: DC | PRN

## 2011-03-22 MED ORDER — TRAMADOL HCL 50 MG PO TABS: ORAL_TABLET | ORAL | Status: DC

## 2011-03-22 NOTE — Progress Notes (Signed)
  Subjective:    Patient ID: Peter Bailey, male    DOB: 1986/07/30, 25 y.o.   MRN: [id number]  HPI New ankle sprain occurred while playing basketball,had to call the paramedics and they took him to [facility], Xrays showed edema and no bony fracture.   He is down and out, girlfriend of 6 years has accused him of things and she has all his belongings, he has no clothes, no ID, etc.  He is excited about his 1/2 brothers, all playing for [employer] this year and one nominated for [employer].   Review of Systems  Musculoskeletal:       Ankel pain and swelling       Objective:   Physical Exam  Constitutional: He appears well-developed and well-nourished.  Musculoskeletal:       Left lateral malleolar swelling, limited ROM secondary to edema and pain.  Able to bear weight.  Psychiatric: His behavior is normal. Thought content normal.          Assessment & Plan:

## 2011-03-22 NOTE — Patient Instructions (Signed)
You know what to do to rehabilitate your ankle Work on your life first, school and family you care about GO [employer] May use the tramadol and flexeril for pain these will not be addicting

## 2011-03-22 NOTE — Assessment & Plan Note (Addendum)
Patient has had many ankle sprains in the past, he knows the rehab. Provided with a ASO  As his old girlfriend will not give him the one he has used.  [employer].  No narcotics, may use tramadol and flexeril for pain prn.

## 2011-03-30 ENCOUNTER — Encounter: Payer: Self-pay | Admitting: Family Medicine

## 2011-05-30 ENCOUNTER — Ambulatory Visit (INDEPENDENT_AMBULATORY_CARE_PROVIDER_SITE_OTHER): Payer: Self-pay | Admitting: Family Medicine

## 2011-05-30 ENCOUNTER — Encounter: Payer: Self-pay | Admitting: Family Medicine

## 2011-05-30 VITALS — BP 124/82 | HR 98 | Temp 98.2°F | Ht 72.0 in | Wt 242.5 lb

## 2011-05-30 DIAGNOSIS — Z23 Encounter for immunization: Secondary | ICD-10-CM

## 2011-05-30 DIAGNOSIS — J069 Acute upper respiratory infection, unspecified: Secondary | ICD-10-CM

## 2011-05-30 DIAGNOSIS — J029 Acute pharyngitis, unspecified: Secondary | ICD-10-CM

## 2011-05-30 LAB — POCT RAPID STREP A (OFFICE): Rapid Strep A Screen: NEGATIVE

## 2011-05-30 NOTE — Patient Instructions (Signed)
It is important to stop smoking to help your lungs heal.  We can help you with that with our smoking counselors!!  Try allergy medicines and nasal saline rinse to help with nasal congestion  Upper Respiratory Infection, Adult An upper respiratory infection (URI) is also sometimes known as the common cold. The upper respiratory tract includes the nose, sinuses, throat, trachea, and bronchi. Bronchi are the airways leading to the lungs. Most people improve within 1 week, but symptoms can last up to 2 weeks. A residual cough may last even longer.   CAUSES Many different viruses can infect the tissues lining the upper respiratory tract. The tissues become irritated and inflamed and often become very moist. Mucus production is also common. A cold is contagious. You can easily spread the virus to others by oral contact. This includes kissing, sharing a glass, coughing, or sneezing. Touching your mouth or nose and then touching a surface, which is then touched by another person, can also spread the virus. SYMPTOMS   Symptoms typically develop 1 to 3 days after you come in contact with a cold virus. Symptoms vary from person to person. They may include:  Runny nose.     Sneezing.    Nasal congestion.     Sinus irritation.     Sore throat.     Loss of voice (laryngitis).     Cough.    Fatigue.    Muscle aches.     Loss of appetite.     Headache.    Low-grade fever.  DIAGNOSIS   You might diagnose your own cold based on familiar symptoms, since most people get a cold 2 to 3 times a year. Your caregiver can confirm this based on your exam. Most importantly, your caregiver can check that your symptoms are not due to another disease such as strep throat, sinusitis, pneumonia, asthma, or epiglottitis. Blood tests, throat tests, and X-rays are not necessary to diagnose a common cold, but they may sometimes be helpful in excluding other more serious diseases. Your caregiver will decide if any  further tests are required. RISKS AND COMPLICATIONS   You may be at risk for a more severe case of the common cold if you smoke cigarettes, have chronic heart disease (such as heart failure) or lung disease (such as asthma), or if you have a weakened immune system. The very young and very old are also at risk for more serious infections. Bacterial sinusitis, middle ear infections, and bacterial pneumonia can complicate the common cold. The common cold can worsen asthma and chronic obstructive pulmonary disease (COPD). Sometimes, these complications can require emergency medical care and may be life-threatening. PREVENTION   The best way to protect against getting a cold is to practice good hygiene. Avoid oral or hand contact with people with cold symptoms. Wash your hands often if contact occurs. There is no clear evidence that vitamin C, vitamin E, echinacea, or exercise reduces the chance of developing a cold. However, it is always recommended to get plenty of rest and practice good nutrition. TREATMENT   Treatment is directed at relieving symptoms. There is no cure. Antibiotics are not effective, because the infection is caused by a virus, not by bacteria. Treatment may include:  Increased fluid intake. Sports drinks offer valuable electrolytes, sugars, and fluids.     Breathing heated mist or steam (vaporizer or shower).     Eating chicken soup or other clear broths, and maintaining good nutrition.     Getting plenty of  rest.     Using gargles or lozenges for comfort.     Controlling fevers with ibuprofen or acetaminophen as directed by your caregiver.     Increasing usage of your inhaler if you have asthma.  Zinc gel and zinc lozenges, taken in the first 24 hours of the common cold, can shorten the duration and lessen the severity of symptoms. Pain medicines may help with fever, muscle aches, and throat pain. A variety of non-prescription medicines are available to treat congestion and  runny nose. Your caregiver can make recommendations and may suggest nasal or lung inhalers for other symptoms.   HOME CARE INSTRUCTIONS    Only take over-the-counter or prescription medicines for pain, discomfort, or fever as directed by your caregiver.     Use a warm mist humidifier or inhale steam from a shower to increase air moisture. This may keep secretions moist and make it easier to breathe.     Drink enough water and fluids to keep your urine clear or pale yellow.     Rest as needed.     Return to work when your temperature has returned to normal or as your caregiver advises. You may need to stay home longer to avoid infecting others. You can also use a face mask and careful hand washing to prevent spread of the virus.  SEEK MEDICAL CARE IF:    After the first few days, you feel you are getting worse rather than better.     You need your caregiver's advice about medicines to control symptoms.     You develop chills, worsening shortness of breath, or brown or red sputum. These may be signs of pneumonia.     You develop yellow or brown nasal discharge or pain in the face, especially when you bend forward. These may be signs of sinusitis.     You develop a fever, swollen neck glands, pain with swallowing, or white areas in the back of your throat. These may be signs of strep throat.  SEEK IMMEDIATE MEDICAL CARE IF:    You have a fever.     You develop severe or persistent headache, ear pain, sinus pain, or chest pain.     You develop wheezing, a prolonged cough, cough up blood, or have a change in your usual mucus (if you have chronic lung disease).     You develop sore muscles or a stiff neck.  Document Released: 01/11/2001 Document Revised: 03/30/2011 Document Reviewed: 11/19/2010 [facility] Patient Information 2012 [place], [place].

## 2011-05-30 NOTE — Assessment & Plan Note (Signed)
Discussed symptomatic treatment, smoking cessation. Patient was given a flu shot today

## 2011-05-30 NOTE — Progress Notes (Signed)
  Subjective:    Patient ID: Peter Bailey, male    DOB: 01-15-1986, 25 y.o.   MRN: [id number]  HPI 25 year old male here for a same day appointment today to evaluate 4 days of nasal congestion, cough, sore throat.  Patient has a past medical history significant for asthma and smoking  He states his worst symptom is nasal congestion and notes coughing which has kept him up at night. He reports taking TheraFlu which helps with muscle aches and nighttime coughing. He denies any dyspnea and has not had to use any albuterol. He thinks he may have had a fever yesterday  He reports he had a looser bowel movement in the past 1-2 days. No emesis per report some nausea this morning  Patient request strep testing due to hearing reports that strep is out in the community Review of Systems General:  [place] for subjective fever,  myalgias HEENT: Negative for conjunctivitis, ear pain or drainage Respiratory:  Negative for  sputum, dyspnea Abdomen: Negative for abdominal pain, emesis Skin:  Negative for rash        Objective:   Physical Exam GEN: Alert & Oriented, No acute distress HEENT: [place]/AT. EOMI, PERRLA, no conjunctival injection or scleral icterus.  Bilateral tympanic membranes intact without erythema or effusion.  .  Nares with boggy turniates  Oropharynx is with mild erythema no exudates No anterior or posterior cervical lymphadenopathy. CV:  Regular Rate & Rhythm, no murmur Respiratory:  Normal work of breathing, CTAB Abd:  + BS, soft, no tenderness to palpation Ext: no pre-tibial edema        Assessment & Plan:

## 2011-11-28 ENCOUNTER — Emergency Department (HOSPITAL_COMMUNITY)
Admission: EM | Admit: 2011-11-28 | Discharge: 2011-11-28 | Disposition: A | Discharge status: Home / Self Care | Payer: Self-pay | Attending: Emergency Medicine | Admitting: Emergency Medicine

## 2011-11-28 ENCOUNTER — Encounter (HOSPITAL_COMMUNITY): Payer: Self-pay | Admitting: Emergency Medicine

## 2011-11-28 ENCOUNTER — Emergency Department (HOSPITAL_COMMUNITY): Payer: Self-pay

## 2011-11-28 DIAGNOSIS — IMO0002 Reserved for concepts with insufficient information to code with codable children: Secondary | ICD-10-CM | POA: Insufficient documentation

## 2011-11-28 DIAGNOSIS — J45909 Unspecified asthma, uncomplicated: Secondary | ICD-10-CM | POA: Insufficient documentation

## 2011-11-28 DIAGNOSIS — F319 Bipolar disorder, unspecified: Secondary | ICD-10-CM | POA: Insufficient documentation

## 2011-11-28 DIAGNOSIS — S62329A Displaced fracture of shaft of unspecified metacarpal bone, initial encounter for closed fracture: Secondary | ICD-10-CM

## 2011-11-28 HISTORY — DX: Bipolar disorder, unspecified: F31.9

## 2011-11-28 MED ORDER — OXYCODONE-ACETAMINOPHEN 5-325 MG PO TABS
2.0000 | ORAL_TABLET | Freq: Once | ORAL | Status: AC
  Administered 2011-11-28: 2 via ORAL
  Filled 2011-11-28: qty 2

## 2011-11-28 MED ORDER — OXYCODONE-ACETAMINOPHEN 5-325 MG PO TABS: ORAL_TABLET | ORAL | Status: DC

## 2011-11-28 NOTE — Discharge Instructions (Signed)
Hand Fracture, Metacarpals  Fractures of metacarpals are breaks in the bones of the hand. They extend from the knuckles to the wrist. These bones can undergo many types of fractures. There are different ways of treating these fractures, all of which may be correct.  TREATMENT   Hand fractures can be treated with:    Non-reduction - The fracture is casted without changing the positions of the fracture (bone pieces) involved. This fracture is usually left in a cast for 4 to 6 weeks or as your caregiver thinks necessary.   Closed reduction - The bones are moved back into position without surgery and then casted.   ORIF (open reduction and internal fixation) - The fracture site is opened and the bone pieces are fixed into place with some type of hardware, such as screws, etc. They are then casted.  Your caregiver will discuss the type of fracture you have and the treatment that should be best for that problem. If surgery is chosen, let your caregivers know about the following.   LET YOUR CAREGIVERS KNOW ABOUT:   Allergies.   Medications you are taking, including herbs, eye drops, over the counter medications, and creams.   Use of steroids (by mouth or creams).   Previous problems with anesthetics or novocaine.   Possibility of pregnancy.   History of blood clots (thrombophlebitis).   History of bleeding or blood problems.   Previous surgeries.   Other health problems.  AFTER THE PROCEDURE  After surgery, you will be taken to the recovery area where a nurse will watch and check your progress. Once you are awake, stable, and taking fluids well, barring other problems, you'll be allowed to go home. Once home, an ice pack applied to your operative site may help with pain and keep the swelling down.  HOME CARE INSTRUCTIONS    Follow your caregiver's instructions as to activities, exercises, physical therapy, and driving a car.   Daily exercise is helpful for keeping range of motion and strength. Exercise as  instructed.   To lessen swelling, keep the injured hand elevated above the level of your heart as much as possible.   Apply ice to the injury for 15 to 20 minutes each hour while awake for the first 2 days. Put the ice in a plastic bag and place a thin towel between the bag of ice and your cast.   Move the fingers of your casted hand several times a day.   If a plaster or fiberglass cast was applied:   Do not try to scratch the skin under the cast using a sharp or pointed object.   Check the skin around the cast every day. You may put lotion on red or sore areas.   Keep your cast dry. Your cast can be protected during bathing with a plastic bag. Do not put your cast into the water.   If a plaster splint was applied:   Wear your splint for as long as directed by your caregiver or until seen again.   Do not get your splint wet. Protect it during bathing with a plastic bag.   You may loosen the elastic bandage around the splint if your fingers start to get numb, tingle, get cold or turn blue.   Do not put pressure on your cast or splint; this may cause it to break. Especially, do not lean plaster casts on hard surfaces for 24 hours after application.   Take medications as directed by your caregiver.     discomfort, or fever as directed by your caregiver.   Follow-up as provided by your caregiver. This is very important in order to avoid permanent injury or disability and chronic pain.  SEEK MEDICAL CARE IF:   Increased bleeding (more than a small spot) from beneath your cast or splint if there is beneath the cast as with an open reduction.   Redness, swelling, or increasing pain in the wound or from beneath your cast or splint.   Pus coming from wound or from beneath your cast or splint.   An unexplained oral temperature above 102 F (38.9 C) develops, or as your caregiver suggests.   A foul smell coming  from the wound or dressing or from beneath your cast or splint.   You have a problem moving any of your fingers.  SEEK IMMEDIATE MEDICAL CARE IF:   You develop a rash   You have difficulty breathing   You have any allergy problems  If you do not have a window in your cast for observing the wound, a discharge or minor bleeding may show up as a stain on the outside of your cast. Report these findings to your caregiver. MAKE SURE YOU:   Understand these instructions.   Will watch your condition.   Will get help right away if you are not doing well or get worse.  Document Released: 07/18/2005 Document Revised: 07/07/2011 Document Reviewed: 03/06/2008 [facility] Patient Information 2012 [place], [place].   Narcotic and benzodiazepine use may cause drowsiness, slowed breathing or dependence.  Please use with caution and do not drive, operate machinery or watch young children alone while taking them.  Taking combinations of these medications or drinking alcohol will potentiate these effects.

## 2011-11-28 NOTE — ED Notes (Signed)
Ice to hand
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
## 2011-11-30 ENCOUNTER — Encounter (HOSPITAL_COMMUNITY): Payer: Self-pay | Admitting: *Deleted

## 2011-11-30 ENCOUNTER — Emergency Department (HOSPITAL_COMMUNITY)
Admission: EM | Admit: 2011-11-30 | Discharge: 2011-11-30 | Disposition: A | Discharge status: Home / Self Care | Payer: Self-pay | Attending: Emergency Medicine | Admitting: Emergency Medicine

## 2011-11-30 ENCOUNTER — Emergency Department (HOSPITAL_COMMUNITY): Payer: Self-pay

## 2011-11-30 DIAGNOSIS — F17200 Nicotine dependence, unspecified, uncomplicated: Secondary | ICD-10-CM | POA: Insufficient documentation

## 2011-11-30 DIAGNOSIS — R062 Wheezing: Secondary | ICD-10-CM | POA: Insufficient documentation

## 2011-11-30 DIAGNOSIS — S60229A Contusion of unspecified hand, initial encounter: Secondary | ICD-10-CM | POA: Insufficient documentation

## 2011-11-30 MED ORDER — ALUM & MAG HYDROXIDE-SIMETH 200-200-20 MG/5ML PO SUSP
30.0000 mL | Freq: Once | ORAL | Status: AC
  Administered 2011-11-30: 30 mL via ORAL
  Filled 2011-11-30: qty 30

## 2011-11-30 MED ORDER — ALBUTEROL SULFATE HFA 108 (90 BASE) MCG/ACT IN AERS
2.0000 | INHALATION_SPRAY | Freq: Once | RESPIRATORY_TRACT | Status: AC
  Administered 2011-11-30: 2 via RESPIRATORY_TRACT
  Filled 2011-11-30: qty 6.7

## 2011-11-30 MED ORDER — OXYCODONE-ACETAMINOPHEN 5-325 MG PO TABS
1.0000 | ORAL_TABLET | Freq: Once | ORAL | Status: AC
  Administered 2011-11-30: 1 via ORAL
  Filled 2011-11-30: qty 1

## 2011-11-30 NOTE — ED Notes (Signed)
Ortho notified for sling 

## 2011-11-30 NOTE — Progress Notes (Signed)
Orthopedic Tech Progress Note Patient Details:  Peter Bailey [birth year] [id number]      Arm  Sling   For   Right  Arm [name] Ray 11/30/2011, 4:12 AM
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
## 2011-11-30 NOTE — ED Provider Notes (Signed)
Medical screening examination/treatment/procedure(s) were performed by non-physician practitioner and as supervising physician I was immediately available for consultation/collaboration.   Yoland Scherr M Maleny Candy, DO 11/30/11 0817 
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
## 2011-12-01 ENCOUNTER — Other Ambulatory Visit: Payer: Self-pay | Admitting: Orthopedic Surgery

## 2011-12-01 ENCOUNTER — Encounter (HOSPITAL_COMMUNITY)
Admission: RE | Admit: 2011-12-01 | Discharge: 2011-12-01 | Disposition: A | Discharge status: Home / Self Care | Payer: Self-pay | Source: Ambulatory Visit | Attending: Orthopedic Surgery | Admitting: Orthopedic Surgery

## 2011-12-01 ENCOUNTER — Encounter (HOSPITAL_COMMUNITY): Payer: Self-pay | Admitting: Pharmacy Technician

## 2011-12-01 ENCOUNTER — Encounter (HOSPITAL_COMMUNITY): Payer: Self-pay

## 2011-12-01 HISTORY — DX: Gastro-esophageal reflux disease without esophagitis: K21.9

## 2011-12-01 HISTORY — DX: Shortness of breath: R06.02

## 2011-12-01 LAB — BASIC METABOLIC PANEL WITH GFR
BUN: 7 mg/dL (ref 6–23)
CO2: 32 meq/L (ref 19–32)
Calcium: 9.4 mg/dL (ref 8.4–10.5)
Chloride: 102 meq/L (ref 96–112)
Creatinine, Ser: 1.38 mg/dL — ABNORMAL HIGH (ref 0.50–1.35)
GFR calc Af Amer: 80 mL/min — ABNORMAL LOW
GFR calc non Af Amer: 69 mL/min — ABNORMAL LOW
Glucose, Bld: 94 mg/dL (ref 70–99)
Potassium: 4.4 meq/L (ref 3.5–5.1)
Sodium: 140 meq/L (ref 135–145)

## 2011-12-01 LAB — SURGICAL PCR SCREEN
MRSA, PCR: NEGATIVE
Staphylococcus aureus: POSITIVE — AB

## 2011-12-01 LAB — CBC
Hemoglobin: 15.1 g/dL (ref 13.0–17.0)
MCH: 31.5 pg (ref 26.0–34.0)
RBC: 4.79 MIL/uL (ref 4.22–5.81)

## 2011-12-01 MED ORDER — CEFAZOLIN SODIUM 1-5 GM-% IV SOLN: 1.0000 g | INTRAVENOUS | Status: DC

## 2011-12-01 MED ORDER — CEFAZOLIN SODIUM-DEXTROSE 2-3 GM-% IV SOLR
2.0000 g | INTRAVENOUS | Status: AC
  Administered 2011-12-02: 2 g via INTRAVENOUS
  Filled 2011-12-01: qty 50

## 2011-12-01 MED ORDER — CHLORHEXIDINE GLUCONATE 4 % EX LIQD: 60.0000 mL | Freq: Once | CUTANEOUS | Status: DC

## 2011-12-01 NOTE — Progress Notes (Signed)
Noted that Orders are placed under other orders and has not crossed.  Larita Fife at dr. Ronie Spies office notified the issues.

## 2011-12-01 NOTE — Pre-Procedure Instructions (Signed)
18 Peter Bailey  12/01/2011   Your procedure is scheduled on:  Dec 02, 2011 at 1130 AM  Report to [facility] at 0930 AM.  Call this number if you have problems the morning of surgery: [phone]   Remember:   Do not eat food:After Midnight.  May have clear liquids: up to 4 Hours before arrival.  Clear liquids include soda, tea, black coffee, apple or grape juice, broth.  Take these medicines the morning of surgery with A SIP OF WATER: Tylenol, Bring inhaler     Do not wear lotions.. You may wear deodorant.  Do not shave 48 hours prior to surgery.  Do not bring valuables to the hospital.  Contacts, dentures or bridgework may not be worn into surgery.  Leave suitcase in the car. After surgery it may be brought to your room.  For patients admitted to the hospital, checkout time is 11:00 AM the day of discharge.   Patients discharged the day of surgery will not be allowed to drive home.  Name and phone number of your driver:   Special Instructions: Incentive Spirometry - Practice and bring it with you on the day of surgery. and CHG Shower Use Special Wash: 1/2 bottle night before surgery and 1/2 bottle morning of surgery.   Please read over the following fact sheets that you were given: Pain Booklet, Coughing and Deep Breathing, MRSA Information and Surgical Site Infection Prevention

## 2011-12-01 NOTE — Pre-Procedure Instructions (Signed)
20 BRANDO TAVES  12/01/2011   Your procedure is scheduled on:  Friday, Dec 02, 2011 @11 :30AM.  Report to [facility] at 9:30 AM.  Call this number if you have problems the morning of surgery: [phone]   Remember:   Do not eat food:After Midnight.  May have clear liquids: up to 4 Hours before arrival( nothing after 5:30AM).  Clear liquids include soda, tea, black coffee, apple or grape juice, broth.  Take these medicines the morning of surgery with A SIP OF WATER: Albuterol   Do not wear jewelry, make-up or nail polish.  Do not wear lotions, powders, or perfumes. You may wear deodorant.  Do not shave 48 hours prior to surgery.  Do not bring valuables to the hospital.  Contacts, dentures or bridgework may not be worn into surgery.  Leave suitcase in the car. After surgery it may be brought to your room.  For patients admitted to the hospital, checkout time is 11:00 AM the day of discharge.   Patients discharged the day of surgery will not be allowed to drive home.  Name and phone number of your driver.  Special Instructions: CHG Shower Use Special Wash: 1/2 bottle night before surgery and 1/2 bottle morning of surgery.   Please read over the following fact sheets that you were given: Pain Booklet, Coughing and Deep Breathing and Surgical Site Infection Prevention
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
## 2011-12-02 ENCOUNTER — Encounter (HOSPITAL_COMMUNITY)
Admission: RE | Disposition: A | Discharge status: Home / Self Care | Payer: Self-pay | Source: Ambulatory Visit | Attending: Orthopedic Surgery

## 2011-12-02 ENCOUNTER — Ambulatory Visit (HOSPITAL_COMMUNITY): Payer: Self-pay | Admitting: Vascular Surgery

## 2011-12-02 ENCOUNTER — Encounter (HOSPITAL_COMMUNITY): Payer: Self-pay | Admitting: *Deleted

## 2011-12-02 ENCOUNTER — Ambulatory Visit (HOSPITAL_COMMUNITY)
Admission: RE | Admit: 2011-12-02 | Discharge: 2011-12-02 | Disposition: A | Discharge status: Home / Self Care | Payer: Self-pay | Source: Ambulatory Visit | Attending: Orthopedic Surgery | Admitting: Orthopedic Surgery

## 2011-12-02 ENCOUNTER — Encounter (HOSPITAL_COMMUNITY): Payer: Self-pay | Admitting: Vascular Surgery

## 2011-12-02 DIAGNOSIS — Z01811 Encounter for preprocedural respiratory examination: Secondary | ICD-10-CM | POA: Insufficient documentation

## 2011-12-02 DIAGNOSIS — F17200 Nicotine dependence, unspecified, uncomplicated: Secondary | ICD-10-CM | POA: Insufficient documentation

## 2011-12-02 DIAGNOSIS — Z01818 Encounter for other preprocedural examination: Secondary | ICD-10-CM | POA: Insufficient documentation

## 2011-12-02 DIAGNOSIS — Z01810 Encounter for preprocedural cardiovascular examination: Secondary | ICD-10-CM | POA: Insufficient documentation

## 2011-12-02 DIAGNOSIS — Z01812 Encounter for preprocedural laboratory examination: Secondary | ICD-10-CM | POA: Insufficient documentation

## 2011-12-02 DIAGNOSIS — X58XXXA Exposure to other specified factors, initial encounter: Secondary | ICD-10-CM | POA: Insufficient documentation

## 2011-12-02 DIAGNOSIS — S62309A Unspecified fracture of unspecified metacarpal bone, initial encounter for closed fracture: Secondary | ICD-10-CM | POA: Insufficient documentation

## 2011-12-02 HISTORY — PX: ORIF FINGER FRACTURE: SHX2122

## 2011-12-02 SURGERY — OPEN REDUCTION INTERNAL FIXATION (ORIF) METACARPAL (FINGER) FRACTURE
Anesthesia: General | Site: Hand | Laterality: Right | Wound class: Clean

## 2011-12-02 MED ORDER — DROPERIDOL 2.5 MG/ML IJ SOLN: 0.6250 mg | INTRAMUSCULAR | Status: DC | PRN

## 2011-12-02 MED ORDER — MIDAZOLAM HCL 2 MG/2ML IJ SOLN
1.0000 mg | INTRAMUSCULAR | Status: DC | PRN
  Administered 2011-12-02: 2 mg via INTRAVENOUS

## 2011-12-02 MED ORDER — PROPOFOL 10 MG/ML IV EMUL
INTRAVENOUS | Status: DC | PRN
  Administered 2011-12-02: 200 mg via INTRAVENOUS
  Administered 2011-12-02: 100 mg via INTRAVENOUS

## 2011-12-02 MED ORDER — MIDAZOLAM HCL 2 MG/2ML IJ SOLN
INTRAMUSCULAR | Status: AC
  Filled 2011-12-02: qty 2

## 2011-12-02 MED ORDER — FENTANYL CITRATE 0.05 MG/ML IJ SOLN
INTRAMUSCULAR | Status: AC
  Filled 2011-12-02: qty 2

## 2011-12-02 MED ORDER — OXYCODONE-ACETAMINOPHEN 5-325 MG PO TABS: 1.0000 | ORAL_TABLET | ORAL | Status: AC | PRN

## 2011-12-02 MED ORDER — HYDROMORPHONE HCL PF 1 MG/ML IJ SOLN: 0.2500 mg | INTRAMUSCULAR | Status: DC | PRN

## 2011-12-02 MED ORDER — FENTANYL CITRATE 0.05 MG/ML IJ SOLN
50.0000 ug | INTRAMUSCULAR | Status: DC | PRN
  Administered 2011-12-02: 100 ug via INTRAVENOUS

## 2011-12-02 MED ORDER — MIDAZOLAM HCL 5 MG/5ML IJ SOLN
INTRAMUSCULAR | Status: DC | PRN
  Administered 2011-12-02: 2 mg via INTRAVENOUS

## 2011-12-02 MED ORDER — 0.9 % SODIUM CHLORIDE (POUR BTL) OPTIME
TOPICAL | Status: DC | PRN
  Administered 2011-12-02: 1000 mL

## 2011-12-02 MED ORDER — LACTATED RINGERS IV SOLN
INTRAVENOUS | Status: DC | PRN
  Administered 2011-12-02 (×2): via INTRAVENOUS

## 2011-12-02 MED ORDER — ONDANSETRON HCL 4 MG/2ML IJ SOLN
INTRAMUSCULAR | Status: DC | PRN
  Administered 2011-12-02: 4 mg via INTRAVENOUS

## 2011-12-02 MED ORDER — BUPIVACAINE-EPINEPHRINE PF 0.5-1:200000 % IJ SOLN
INTRAMUSCULAR | Status: DC | PRN
  Administered 2011-12-02: 150 mg

## 2011-12-02 MED ORDER — FENTANYL CITRATE 0.05 MG/ML IJ SOLN
INTRAMUSCULAR | Status: DC | PRN
  Administered 2011-12-02: 100 ug via INTRAVENOUS
  Administered 2011-12-02: 150 ug via INTRAVENOUS

## 2011-12-02 MED ORDER — LACTATED RINGERS IV SOLN
INTRAVENOUS | Status: DC
  Administered 2011-12-02: 12:00:00 via INTRAVENOUS

## 2011-12-02 MED ORDER — DEXTROSE 5 % IV SOLN
INTRAVENOUS | Status: DC | PRN
  Administered 2011-12-02: 14:00:00 via INTRAVENOUS

## 2011-12-02 SURGICAL SUPPLY — 65 items
APL SKNCLS STERI-STRIP NONHPOA (GAUZE/BANDAGES/DRESSINGS) ×1
BANDAGE ELASTIC 3 VELCRO ST LF (GAUZE/BANDAGES/DRESSINGS) IMPLANT
BANDAGE ELASTIC 4 VELCRO ST LF (GAUZE/BANDAGES/DRESSINGS) ×2 IMPLANT
BANDAGE GAUZE ELAST BULKY 4 IN (GAUZE/BANDAGES/DRESSINGS) ×4 IMPLANT
BENZOIN TINCTURE PRP APPL 2/3 (GAUZE/BANDAGES/DRESSINGS) ×2 IMPLANT
BIT DRILL 1.1 (BIT) ×2
BIT DRILL 60X20X1.1XQC TMX (BIT) ×1 IMPLANT
BIT DRL 60X20X1.1XQC TMX (BIT) ×1
BNDG ADH 5X3 H2O RPLNT NS (GAUZE/BANDAGES/DRESSINGS)
BNDG CMPR 9X4 STRL LF SNTH (GAUZE/BANDAGES/DRESSINGS) ×1
BNDG COHESIVE 1X5 TAN STRL LF (GAUZE/BANDAGES/DRESSINGS) IMPLANT
BNDG COHESIVE 3X5 WHT NS (GAUZE/BANDAGES/DRESSINGS) IMPLANT
BNDG ESMARK 4X9 LF (GAUZE/BANDAGES/DRESSINGS) ×2 IMPLANT
CLOTH BEACON ORANGE TIMEOUT ST (SAFETY) ×2 IMPLANT
CORDS BIPOLAR (ELECTRODE) ×2 IMPLANT
COVER SURGICAL LIGHT HANDLE (MISCELLANEOUS) ×2 IMPLANT
CUFF TOURNIQUET SINGLE 18IN (TOURNIQUET CUFF) ×2 IMPLANT
CUFF TOURNIQUET SINGLE 24IN (TOURNIQUET CUFF) IMPLANT
DRAPE OEC MINIVIEW 54X84 (DRAPES) ×2 IMPLANT
DRAPE SURG 17X23 STRL (DRAPES) ×2 IMPLANT
DURAPREP 26ML APPLICATOR (WOUND CARE) ×2 IMPLANT
GAUZE SPONGE 2X2 8PLY STRL LF (GAUZE/BANDAGES/DRESSINGS) IMPLANT
GAUZE XEROFORM 1X8 LF (GAUZE/BANDAGES/DRESSINGS) IMPLANT
GLOVE BIO SURGEON STRL SZ8.5 (GLOVE) ×2 IMPLANT
GLOVE BIOGEL PI IND STRL 7.5 (GLOVE) ×2 IMPLANT
GLOVE BIOGEL PI INDICATOR 7.5 (GLOVE) ×2
GLOVE SURG SS PI 7.5 STRL IVOR (GLOVE) ×2 IMPLANT
GOWN BRE IMP SLV SIRUS LXLNG (GOWN DISPOSABLE) ×4 IMPLANT
GOWN SRG XL XLNG 56XLVL 4 (GOWN DISPOSABLE) ×1 IMPLANT
GOWN STRL NON-REIN LRG LVL3 (GOWN DISPOSABLE) IMPLANT
GOWN STRL NON-REIN XL XLG LVL4 (GOWN DISPOSABLE) ×2
GOWN STRL REIN XL XLG (GOWN DISPOSABLE) ×2 IMPLANT
KIT BASIN OR (CUSTOM PROCEDURE TRAY) ×2 IMPLANT
KIT ROOM TURNOVER OR (KITS) ×2 IMPLANT
MANIFOLD NEPTUNE II (INSTRUMENTS) ×2 IMPLANT
NEEDLE HYPO 25GX1X1/2 BEV (NEEDLE) IMPLANT
NEEDLE HYPO 25X1 1.5 SAFETY (NEEDLE) IMPLANT
NS IRRIG 1000ML POUR BTL (IV SOLUTION) ×2 IMPLANT
PACK ORTHO EXTREMITY (CUSTOM PROCEDURE TRAY) ×2 IMPLANT
PAD ARMBOARD 7.5X6 YLW CONV (MISCELLANEOUS) ×2 IMPLANT
PAD CAST 3X4 CTTN HI CHSV (CAST SUPPLIES) ×1 IMPLANT
PAD CAST 4YDX4 CTTN HI CHSV (CAST SUPPLIES) ×1 IMPLANT
PADDING CAST COTTON 3X4 STRL (CAST SUPPLIES) ×2
PADDING CAST COTTON 4X4 STRL (CAST SUPPLIES) ×2
PLATE STRAIGHT LOCK 1.5 (Plate) ×2 IMPLANT
SCREW NL 1.5X11 WRIST (Screw) ×4 IMPLANT
SCREW NL 1.5X12 (Screw) ×4 IMPLANT
SCREW NL 1.5X13 (Screw) ×4 IMPLANT
SPONGE GAUZE 2X2 STER 10/PKG (GAUZE/BANDAGES/DRESSINGS)
SPONGE GAUZE 4X4 12PLY (GAUZE/BANDAGES/DRESSINGS) ×4 IMPLANT
STRIP CLOSURE SKIN 1/2X4 (GAUZE/BANDAGES/DRESSINGS) ×4 IMPLANT
SUCTION FRAZIER TIP 10 FR DISP (SUCTIONS) ×2 IMPLANT
SUT ETHILON 4 0 PS 2 18 (SUTURE) IMPLANT
SUT ETHILON 5 0 PS 2 18 (SUTURE) IMPLANT
SUT PROLENE 3 0 PS 2 (SUTURE) IMPLANT
SUT VIC AB 3-0 FS2 27 (SUTURE) IMPLANT
SUT VIC AB 4-0 P-3 18X BRD (SUTURE) ×1 IMPLANT
SUT VIC AB 4-0 P3 18 (SUTURE) ×2
SUT VICRYL RAPIDE 4/0 PS 2 (SUTURE) ×2 IMPLANT
SYR CONTROL 10ML LL (SYRINGE) IMPLANT
TOWEL OR 17X24 6PK STRL BLUE (TOWEL DISPOSABLE) ×2 IMPLANT
TOWEL OR 17X26 10 PK STRL BLUE (TOWEL DISPOSABLE) ×2 IMPLANT
TUBE CONNECTING 12X1/4 (SUCTIONS) ×2 IMPLANT
UNDERPAD 30X30 INCONTINENT (UNDERPADS AND DIAPERS) ×2 IMPLANT
WATER STERILE IRR 1000ML POUR (IV SOLUTION) IMPLANT

## 2011-12-02 NOTE — H&P (Signed)
Peter Bailey is an 26 y.o. male.   Chief Complaint: right hand pain HPI: right 4th metacarpal fracture  Past Medical History  Diagnosis Date  . Asthma   . Bipolar 1 disorder   . Shortness of breath   . GERD (gastroesophageal reflux disease)     OTC meds    Past Surgical History  Procedure Date  . Appendectomy   . Mandible surgery     Family History  Problem Relation Age of Onset  . Anesthesia problems Neg Hx   . Hypotension Neg Hx   . Malignant hyperthermia Neg Hx   . Pseudochol deficiency Neg Hx    Social History:  reports that he has been smoking Cigarettes.  He has a 5 pack-year smoking history. He has never used smokeless tobacco. He reports that he drinks alcohol. He reports that he does not use illicit drugs.  Allergies:  Allergies  Allergen Reactions  . Ibuprofen Other (See Comments)    REACTION: makes eyes swell.    Medications Prior to Admission  Medication Sig Dispense Refill  . acetaminophen (TYLENOL) 500 MG tablet Take 1,000 mg by mouth every 6 (six) hours as needed. For pain      . albuterol (VENTOLIN HFA) 108 (90 BASE) MCG/ACT inhaler Inhale 2 puffs into the lungs every 4 (four) hours as needed. For cough and wheeze       . Multiple Vitamin (MULITIVITAMIN WITH MINERALS) TABS Take 1 tablet by mouth daily.      . Multiple Vitamins-Minerals (HAIR/SKIN/NAILS) TABS Take 1 tablet by mouth daily.        Results for orders placed during the hospital encounter of 12/01/11 (from the past 48 hour(s))  SURGICAL PCR SCREEN     Status: Abnormal   Collection Time   12/01/11  1:38 PM      Component Value Range Comment   MRSA, PCR NEGATIVE  NEGATIVE     Staphylococcus aureus POSITIVE (*) NEGATIVE    CBC     Status: Normal   Collection Time   12/01/11  1:38 PM      Component Value Range Comment   WBC 9.7  4.0 - 10.5 (K/uL)    RBC 4.79  4.22 - 5.81 (MIL/uL)    Hemoglobin 15.1  13.0 - 17.0 (g/dL)    HCT 16.1  09.6 - 04.5 (%)    MCV 90.8  78.0 - 100.0 (fL)    MCH  31.5  26.0 - 34.0 (pg)    MCHC 34.7  30.0 - 36.0 (g/dL)    RDW 40.9  81.1 - 91.4 (%)    Platelets 219  150 - 400 (K/uL)   BASIC METABOLIC PANEL     Status: Abnormal   Collection Time   12/01/11  1:38 PM      Component Value Range Comment   Sodium 140  135 - 145 (mEq/L)    Potassium 4.4  3.5 - 5.1 (mEq/L)    Chloride 102  96 - 112 (mEq/L)    CO2 32  19 - 32 (mEq/L)    Glucose, Bld 94  70 - 99 (mg/dL)    BUN 7  6 - 23 (mg/dL)    Creatinine, Ser 7.82 (*) 0.50 - 1.35 (mg/dL)    Calcium 9.4  8.4 - 10.5 (mg/dL)    GFR calc non Af Amer 69 (*) >90 (mL/min)    GFR calc Af Amer 80 (*) >90 (mL/min)    Dg Chest 2 View  12/01/2011  *RADIOLOGY  REPORT*  Clinical Data: Operative evaluation.  Asthma with shortness of breath.   Five pack year history of smoking  CHEST - 2 VIEW  Comparison: 08/10/2008 and 08/31/2009  Findings: Heart and mediastinal contours are within normal limits. The lung fields demonstrate a small 4 mm nodular density in the lateral right lung overlying the interspace between the posterior aspect of the seventh and eighth ribs on the frontal view and seen on the frontal view only.  Retrospectively this is felt to be present on the prior exam from 2010 suggesting a benign etiology. No new focal infiltrates or signs of congestive failure are seen. Mild central peribronchial cuffing is stable and correlates with the history of asthma.  No pleural fluid is identified.  Bony structures appear intact.  IMPRESSION: Stable cardiopulmonary appearance with no new focal or acute abnormality seen.  Original Report Authenticated By: Bertha Stakes, M.D.    Review of Systems  All other systems reviewed and are negative.    Blood pressure 185/79, pulse 79, temperature 97.9 F (36.6 C), temperature source Oral, resp. rate 22, SpO2 100.00%. Physical Exam  Constitutional: He is oriented to person, place, and time. He appears well-developed and well-nourished.  HENT:  Head: Normocephalic and  atraumatic.  Cardiovascular: Normal rate.   Respiratory: Effort normal.  Musculoskeletal:       Right hand: He exhibits tenderness, bony tenderness and swelling.  Neurological: He is alert and oriented to person, place, and time.  Skin: Skin is warm.  Psychiatric: He has a normal mood and affect. His speech is normal and behavior is normal. Thought content normal.     Assessment/Plan Plan orif  Kariel Skillman A 12/02/2011, 2:39 PM

## 2011-12-02 NOTE — Preoperative (Signed)
Beta Blockers   Reason not to administer Beta Blockers:Not Applicable 

## 2011-12-02 NOTE — Anesthesia Postprocedure Evaluation (Signed)
Anesthesia Post Note  Patient: Peter Bailey  Procedure(s) Performed: Procedure(s) (LRB): OPEN REDUCTION INTERNAL FIXATION (ORIF) METACARPAL (FINGER) FRACTURE (Right)  Anesthesia type: General  Patient location: PACU  Post pain: Pain level controlled and Adequate analgesia  Post assessment: Post-op Vital signs reviewed, Patient's Cardiovascular Status Stable, Respiratory Function Stable, Patent Airway and Pain level controlled  Last Vitals:  Filed Vitals:   12/02/11 1500  BP:   Pulse: 74  Temp:   Resp: 17    Post vital signs: Reviewed and stable  Level of consciousness: awake, alert  and oriented  Complications: No apparent anesthesia complications

## 2011-12-02 NOTE — Anesthesia Preprocedure Evaluation (Addendum)
Anesthesia Evaluation  Patient identified by MRN, date of birth, ID band Patient awake    Reviewed: Allergy & Precautions, H&P , NPO status , Patient's Chart, lab work & pertinent test results  Airway Mallampati: II TM Distance: >3 FB Neck ROM: Full    Dental  (+) Teeth Intact, Caps and Dental Advisory Given,    Pulmonary asthma , Current Smoker,  breath sounds clear to auscultation  Pulmonary exam normal       Cardiovascular negative cardio ROS  Rhythm:Regular Rate:Normal     Neuro/Psych PSYCHIATRIC DISORDERS Bipolar Disorder    GI/Hepatic Neg liver ROS, GERD-  Controlled,  Endo/Other    Renal/GU negative Renal ROS     Musculoskeletal   Abdominal   Peds  Hematology negative hematology ROS (+)   Anesthesia Other Findings   Reproductive/Obstetrics                        Anesthesia Physical Anesthesia Plan  ASA: II  Anesthesia Plan: General   Post-op Pain Management:    Induction: Intravenous  Airway Management Planned: LMA  Additional Equipment:   Intra-op Plan:   Post-operative Plan:   Informed Consent: I have reviewed the patients History and Physical, chart, labs and discussed the procedure including the risks, benefits and alternatives for the proposed anesthesia with the patient or authorized representative who has indicated his/her understanding and acceptance.   Dental advisory given  Plan Discussed with: CRNA, Anesthesiologist and Surgeon  Anesthesia Plan Comments:         Anesthesia Quick Evaluation

## 2011-12-02 NOTE — Anesthesia Procedure Notes (Addendum)
Anesthesia Regional Block:  Supraclavicular block  Pre-Anesthetic Checklist: ,, timeout performed, Correct Patient, Correct Site, Correct Laterality, Correct Procedure,, site marked, risks and benefits discussed, Surgical consent,  Pre-op evaluation,  At surgeon's request and post-op pain management  Laterality: Right  Prep: chloraprep       Needles:  Injection technique: Single-shot  Needle Type: Echogenic Stimulator Needle     Needle Length: 5cm 5 cm Needle Gauge: 22 and 22 G    Additional Needles:  Procedures: ultrasound guided and nerve stimulator Supraclavicular block  Nerve Stimulator or Paresthesia:  Response: bicep contraction, 0.45 mA,   Additional Responses:   Narrative:  Start time: 12/02/2011 12:03 PM End time: 12/02/2011 12:14 PM Injection made incrementally with aspirations every 5 mL.  Performed by: Personally  Anesthesiologist: J. Adonis Huguenin, MD  Additional Notes: Functioning IV was confirmed and monitors applied.  A 50mm 22ga echogenic arrow stimulator was used. Sterile prep and drape,hand hygiene and sterile gloves were used.Ultrasound guidance: relevant anatomy identified, needle position confirmed, local anesthetic spread visualized around nerve(s)., vascular puncture avoided.  Image printed for medical record.  Negative aspiration and negative test dose prior to incremental administration of local anesthetic. The patient tolerated the procedure well.  Interscalene brachial plexus block Procedure Name: LMA Insertion Date/Time: 12/02/2011 1:55 PM Performed by: Kaneshia Cater S Pre-anesthesia Checklist: Patient identified, Emergency Drugs available, Suction available, Patient being monitored and Timeout performed Patient Re-evaluated:Patient Re-evaluated prior to inductionOxygen Delivery Method: Circle system utilized Preoxygenation: Pre-oxygenation with 100% oxygen Intubation Type: IV induction LMA: LMA inserted LMA Size: 5.0 Tube type: Oral Number of  attempts: 1 Placement Confirmation: positive ETCO2 and breath sounds checked- equal and bilateral Tube secured with: Tape Dental Injury: Teeth and Oropharynx as per pre-operative assessment

## 2011-12-02 NOTE — Transfer of Care (Signed)
Immediate Anesthesia Transfer of Care Note  Patient: Peter Bailey  Procedure(s) Performed: Procedure(s) (LRB): OPEN REDUCTION INTERNAL FIXATION (ORIF) METACARPAL (FINGER) FRACTURE (Right)  Patient Location: PACU  Anesthesia Type: General and Regional  Level of Consciousness: awake, alert , oriented and patient cooperative  Airway & Oxygen Therapy: Patient Spontanous Breathing and Patient connected to nasal cannula oxygen  Post-op Assessment: Report given to PACU RN, Post -op Vital signs reviewed and stable and Patient moving all extremities X 4  Post vital signs: Reviewed and stable  Complications: No apparent anesthesia complications

## 2011-12-02 NOTE — Op Note (Signed)
Peter Bailey, Peter Bailey              ACCOUNT NO.:  [id number]  MEDICAL RECORD NO.:  [id number]  LOCATION:  [facility]                         FACILITY:  [facility]  PHYSICIAN:  Artist Pais. Levii Hairfield, M.D.DATE OF BIRTH:  Oct 19, 1985  DATE OF PROCEDURE:  12/02/2011 DATE OF DISCHARGE:  12/02/2011                              OPERATIVE REPORT   PREOPERATIVE DIAGNOSIS:  Displaced right ring finger metacarpal fracture.  POSTOPERATIVE DIAGNOSIS:  Displaced right ring finger metacarpal fracture.  PROCEDURE:  ORIF of above using 6-hole 1.5 mm ALPS fracture set, 3 cortices above and below the fracture.  SURGEON:  Artist Pais. Mina Marble, MD  ASSISTANT:  None.  ANESTHESIA:  General.  COMPLICATIONS:  No complication.  DRAINS:  No drains.  DESCRIPTION OF PROCEDURE:  The patient was taken to the operating suite. After induction of adequate general anesthesia, the right upper extremity was prepped and draped in usual sterile fashion.  An Esmarch was used to exsanguinate the limb.  Tourniquet was then inflated to 250 mmHg.  At this point in time, incision was made over the dorsal aspect of the right hand over the ring metacarpal.  Skin was incised.  The extensor tendons were retracted radially.  Periosteum was subperiosteally stripped.  The fracture site was identified, debrided off clot and reduced, held with a reduction clamp, fixed with a dorsally applied 1.5 mm 6-hole plate with 3 cortices above, 3 cortices below the fracture.  Intraoperative fluoroscopy revealed adequate reduction in AP, lateral, oblique view.  The wound was irrigated.  The periosteum was closed with 4-0 Vicryl and the skin with a running-locked buried 4-0 Vicryl Rapide subcuticular stitch.  Steri-Strips, 4x4s, fluffs, and a compressive dressing with volar splint was applied.  The patient tolerated the procedure well.     Artist Pais Mina Marble, M.D.     MAW/MEDQ  D:  12/02/2011  T:  12/02/2011  Job:  [id number]

## 2011-12-02 NOTE — Op Note (Signed)
See dictataed note [id number]

## 2011-12-02 NOTE — Brief Op Note (Signed)
12/02/2011  2:41 PM  PATIENT:  Peter Bailey  26 y.o. male  PRE-OPERATIVE DIAGNOSIS:  RIGHT RING METACARPAL FRACTURE  POST-OPERATIVE DIAGNOSIS:  RIGHT RING METACARPAL FRACTURE  PROCEDURE:  Procedure(s) (LRB): OPEN REDUCTION INTERNAL FIXATION (ORIF) METACARPAL (FINGER) FRACTURE (Right)  SURGEON:  Surgeon(s) and Role:    * Marlowe Shores, MD - Primary  PHYSICIAN ASSISTANT:   ASSISTANTS: none   ANESTHESIA:   general  EBL:  Total I/O In: 1450 [I.V.:1450] Out: -   BLOOD ADMINISTERED:none  DRAINS: none   LOCAL MEDICATIONS USED:  NONE  SPECIMEN:  No Specimen  DISPOSITION OF SPECIMEN:  N/A  COUNTS:  YES  TOURNIQUET:   Total Tourniquet Time Documented: Upper Arm (Right) - 32 minutes  DICTATION: .Other Dictation: Dictation Number [id number]  PLAN OF CARE: Discharge to home after PACU  PATIENT DISPOSITION:  PACU - hemodynamically stable.   Delay start of Pharmacological VTE agent (>24hrs) due to surgical blood loss or risk of bleeding: not applicable

## 2011-12-02 NOTE — Discharge Instructions (Signed)
Hand Fracture Your caregiver has diagnosed you with a fractured (broken) bone in your hand. If the bones are in good position and the hand is properly immobilized and rested, these injuries will usually heal in 3 to 6 weeks. A cast, splint, or bulky bandage is usually applied to keep the fracture site from moving. Do not remove the splint or cast until your caregiver approves. If the fracture is unstable or the bones are not aligned properly, surgery may be needed. Keep your hand raised (elevated) above the level of your heart as much as possible for the next 2 to 3 days until the swelling and pain are better. Apply ice packs for 15 to 20 minutes every 3 to 4 hours to help control the pain and swelling. See your caregiver or an orthopedic specialist as directed for follow-up care to make sure the fracture is beginning to heal properly. SEEK IMMEDIATE MEDICAL CARE IF:   You notice your fingers are cold, numb, crooked, or the pain of your injury is severe.   You are not improving or seem to be getting worse.   You have questions or concerns.  Document Released: 08/25/2004 Document Revised: 07/07/2011 Document Reviewed: 01/13/2009 Exit[employer] Patient Information 2012 [employer]. 

## 2011-12-05 ENCOUNTER — Encounter (HOSPITAL_COMMUNITY): Payer: Self-pay | Admitting: Orthopedic Surgery

## 2012-01-02 ENCOUNTER — Encounter: Payer: Self-pay | Admitting: Emergency Medicine

## 2012-01-02 ENCOUNTER — Ambulatory Visit (INDEPENDENT_AMBULATORY_CARE_PROVIDER_SITE_OTHER): Payer: Self-pay | Admitting: Emergency Medicine

## 2012-01-02 ENCOUNTER — Telehealth: Payer: Self-pay | Admitting: *Deleted

## 2012-01-02 VITALS — BP 136/85 | HR 90 | Ht 72.0 in | Wt 234.5 lb

## 2012-01-02 DIAGNOSIS — M545 Low back pain, unspecified: Secondary | ICD-10-CM

## 2012-01-02 MED ORDER — TRAMADOL HCL 50 MG PO TABS: 50.0000 mg | ORAL_TABLET | Freq: Three times a day (TID) | ORAL | Status: DC | PRN

## 2012-01-02 MED ORDER — CYCLOBENZAPRINE HCL 10 MG PO TABS: 10.0000 mg | ORAL_TABLET | Freq: Two times a day (BID) | ORAL | Status: DC | PRN

## 2012-01-02 MED ORDER — TRAMADOL HCL 50 MG PO TABS: 50.0000 mg | ORAL_TABLET | Freq: Three times a day (TID) | ORAL | Status: AC | PRN

## 2012-01-02 MED ORDER — CYCLOBENZAPRINE HCL 10 MG PO TABS: 10.0000 mg | ORAL_TABLET | Freq: Two times a day (BID) | ORAL | Status: AC | PRN

## 2012-01-02 NOTE — Assessment & Plan Note (Signed)
Recurrent episodes of low back pain.  Likely MSK.  No red flags for infection or cauda equina.  Will give tramadol and flexeril for pain as allergic to NSAIDs.  Will also get lumbar films to evaluate for bony involvement.  Also placed referral for PT.  Follow up in 1-2 weeks if no improvement.

## 2012-01-02 NOTE — Telephone Encounter (Signed)
Called pt and spoke with his grandmother. [facility] does not exist anymore. New pharmacy is [facility]. Did put it in the chart and fwd. To Dr.Booth to send medications there. Lorenda Hatchet, Renato Battles

## 2012-01-02 NOTE — Patient Instructions (Signed)
It was nice to meet you.  I have sent a prescription for tramadol to your pharmacy.  I would also like you to go the radiology department in the hospital for x-rays of your back (you can go whenever you have time).  I have also placed a referral to physical therapy.  They should be calling you with an appointment soon.  I would like you to come back to clinic in 1-2 weeks to follow up your pain.

## 2012-01-02 NOTE — Progress Notes (Signed)
  Subjective:    Patient ID: Peter Bailey, male    DOB: [birth year], 26 y.o.   MRN: [id number]  HPI Peter Bailey is here for a SDA for low back pain.  The current low back pain started about 4 days ago.  No identified trigger, but does state has been lifting more recently.  Sharp stabbing pain in right lower back, no radiation.  Has tried tramadol, tylenol and percocet (left over from hand surgery); only the percocet temporarily helped.  Has also tried yoga, massage, heat/cold, and icy hot with minimal help.  Did slip in the shower yesterday and hit the spot in his back on the handle.  Has had this pain several times before.  Says it started about 1 year ago and every couple of months his back will hurt for about 1 week then ease off.  No fever, n/v, bowel/bladder incontinence.    I have reviewed and updated the following as appropriate: allergies and current medications   Review of Systems See HPI.    Objective:   Physical Exam BP 136/85  Pulse 90  Ht 6' (1.829 m)  Wt 234 lb 8 oz (106.369 kg)  BMI 31.80 kg/m2 Gen: alert, cooperative, occasional mild distress Back: no erythema or obvious deformity, tender to palpation over right paraspinous muscles along L3 to sacrum.  Full ROM, but pain present.  Negative SLR. Faber's positive on left, negative on right     Assessment & Plan:

## 2012-01-16 ENCOUNTER — Ambulatory Visit: Payer: Self-pay | Attending: Emergency Medicine

## 2012-03-28 ENCOUNTER — Ambulatory Visit (INDEPENDENT_AMBULATORY_CARE_PROVIDER_SITE_OTHER): Payer: Self-pay | Admitting: Family Medicine

## 2012-03-28 ENCOUNTER — Encounter: Payer: Self-pay | Admitting: Family Medicine

## 2012-03-28 VITALS — BP 135/97 | HR 87 | Ht 73.0 in | Wt 232.0 lb

## 2012-03-28 DIAGNOSIS — F319 Bipolar disorder, unspecified: Secondary | ICD-10-CM

## 2012-03-28 MED ORDER — FLUOXETINE HCL 20 MG PO CAPS: 20.0000 mg | ORAL_CAPSULE | Freq: Every day | ORAL | Status: DC

## 2012-03-28 MED ORDER — MELOXICAM 15 MG PO TABS: 15.0000 mg | ORAL_TABLET | Freq: Every day | ORAL | Status: DC

## 2012-03-28 NOTE — Patient Instructions (Addendum)
Thank you for coming in today, it was nice to meet you I have sent in a medication called fluoxetine for you I will see you back in two weeks. If you feel like you are starting to shift to a manic phase please call us sooner or go to behavioral health at [phone] Once you get the orange card you can make an appt with our psychologist: I recommend that you meet with our psychologist, Dr. Spero Geralds, for help dealing with your bipolar.  You can schedule an appointment with her by calling her directly at [phone].

## 2012-04-02 ENCOUNTER — Emergency Department (HOSPITAL_COMMUNITY)
Admission: EM | Admit: 2012-04-02 | Discharge: 2012-04-03 | Disposition: A | Discharge status: Home / Self Care | Payer: Self-pay | Attending: Emergency Medicine | Admitting: Emergency Medicine

## 2012-04-02 ENCOUNTER — Encounter (HOSPITAL_COMMUNITY): Payer: Self-pay | Admitting: *Deleted

## 2012-04-02 DIAGNOSIS — K219 Gastro-esophageal reflux disease without esophagitis: Secondary | ICD-10-CM | POA: Insufficient documentation

## 2012-04-02 DIAGNOSIS — Z9089 Acquired absence of other organs: Secondary | ICD-10-CM | POA: Insufficient documentation

## 2012-04-02 DIAGNOSIS — F17200 Nicotine dependence, unspecified, uncomplicated: Secondary | ICD-10-CM | POA: Insufficient documentation

## 2012-04-02 DIAGNOSIS — R109 Unspecified abdominal pain: Secondary | ICD-10-CM | POA: Insufficient documentation

## 2012-04-02 DIAGNOSIS — R51 Headache: Secondary | ICD-10-CM | POA: Insufficient documentation

## 2012-04-02 DIAGNOSIS — E876 Hypokalemia: Secondary | ICD-10-CM | POA: Insufficient documentation

## 2012-04-02 DIAGNOSIS — F319 Bipolar disorder, unspecified: Secondary | ICD-10-CM | POA: Insufficient documentation

## 2012-04-02 LAB — URINE MICROSCOPIC-ADD ON

## 2012-04-02 LAB — URINALYSIS, ROUTINE W REFLEX MICROSCOPIC
Glucose, UA: NEGATIVE mg/dL
Ketones, ur: NEGATIVE mg/dL
Leukocytes, UA: NEGATIVE
Nitrite: NEGATIVE
Specific Gravity, Urine: 1.014 (ref 1.005–1.030)
pH: 8 (ref 5.0–8.0)

## 2012-04-02 LAB — CBC WITH DIFFERENTIAL/PLATELET
Basophils Absolute: 0 10*3/uL (ref 0.0–0.1)
Eosinophils Relative: 1 % (ref 0–5)
HCT: 39.6 % (ref 39.0–52.0)
Lymphocytes Relative: 22 % (ref 12–46)
Lymphs Abs: 2.3 10*3/uL (ref 0.7–4.0)
MCH: 31.9 pg (ref 26.0–34.0)
MCV: 92.7 fL (ref 78.0–100.0)
Monocytes Absolute: 0.6 10*3/uL (ref 0.1–1.0)
RDW: 13.7 % (ref 11.5–15.5)
WBC: 10.3 10*3/uL (ref 4.0–10.5)

## 2012-04-02 MED ORDER — SODIUM CHLORIDE 0.9 % IV BOLUS (SEPSIS)
1000.0000 mL | Freq: Once | INTRAVENOUS | Status: AC
  Administered 2012-04-02: 1000 mL via INTRAVENOUS

## 2012-04-02 MED ORDER — GI COCKTAIL ~~LOC~~
30.0000 mL | Freq: Once | ORAL | Status: AC
  Administered 2012-04-02: 30 mL via ORAL
  Filled 2012-04-02: qty 30

## 2012-04-02 MED ORDER — DIPHENHYDRAMINE HCL 50 MG/ML IJ SOLN
25.0000 mg | Freq: Once | INTRAMUSCULAR | Status: AC
  Administered 2012-04-02: 25 mg via INTRAVENOUS
  Filled 2012-04-02: qty 1

## 2012-04-02 MED ORDER — METOCLOPRAMIDE HCL 5 MG/ML IJ SOLN
10.0000 mg | Freq: Once | INTRAMUSCULAR | Status: AC
  Administered 2012-04-02: 10 mg via INTRAVENOUS
  Filled 2012-04-02: qty 2

## 2012-04-02 NOTE — ED Notes (Signed)
The pt started having a headache  2 hours ago.  He took a meloxicam and he now has abd pain for 3o minutes he thinks it was the med and he still has a headache

## 2012-04-02 NOTE — ED Provider Notes (Signed)
History     CSN: [id number]  Arrival date & time 04/02/12  2120   First MD Initiated Contact with Patient 04/02/12 2259      Chief Complaint  Patient presents with  . abd pain 30 minutes     HPI  The patient presents with headache and abdominal pain.  Notably, the patient also was recently diagnosed with possible new hypertension, for which he has not started medication.  Patient notes that over the past days his generalized complaints, and time after he gradually developed a diffuse, posterior, sore headache he took medication, meloxicam, and soon thereafter developed epigastric pain, nausea, anorexia.  He denies vomiting, diarrhea, chest pain or shortness of breath.  Yes and denies any visual changes, confusion, disorientation, weakness. No clear alleviating or exacerbating factors.   Past Medical History  Diagnosis Date  . Asthma   . Bipolar 1 disorder   . Shortness of breath   . GERD (gastroesophageal reflux disease)     OTC meds    Past Surgical History  Procedure Date  . Appendectomy   . Mandible surgery   . Orif finger fracture 12/02/2011    Procedure: OPEN REDUCTION INTERNAL FIXATION (ORIF) METACARPAL (FINGER) FRACTURE;  Surgeon: Marlowe Shores, MD;  Location: [facility] OR;  Service: Orthopedics;  Laterality: Right;  right ring metatarsal    Family History  Problem Relation Age of Onset  . Anesthesia problems Neg Hx   . Hypotension Neg Hx   . Malignant hyperthermia Neg Hx   . Pseudochol deficiency Neg Hx     History  Substance Use Topics  . Smoking status: Current Everyday Smoker -- 0.5 packs/day for 10 years    Types: Cigarettes  . Smokeless tobacco: Never Used  . Alcohol Use: Yes     occasional      Review of Systems  Constitutional:       Per HPI, otherwise negative  HENT:       Per HPI, otherwise negative  Eyes: Negative.   Respiratory:       Per HPI, otherwise negative  Cardiovascular:       Per HPI, otherwise negative  Gastrointestinal:  Negative for vomiting.  Genitourinary: Negative.   Musculoskeletal:       Per HPI, otherwise negative  Skin: Negative.   Neurological: Negative for syncope.    Allergies  Ibuprofen  Home Medications   Current Outpatient Rx  Name Route Sig Dispense Refill  . ACETAMINOPHEN 500 MG PO TABS Oral Take 1,000 mg by mouth every 6 (six) hours as needed. For pain    . ALBUTEROL SULFATE HFA 108 (90 BASE) MCG/ACT IN AERS Inhalation Inhale 2 puffs into the lungs every 4 (four) hours as needed. For cough and wheeze     . MELOXICAM 15 MG PO TABS Oral Take 1 tablet (15 mg total) by mouth daily. 30 tablet 0  . FLUOXETINE HCL 20 MG PO CAPS Oral Take 1 capsule (20 mg total) by mouth daily. 30 capsule 1    BP 145/93  Pulse 60  Temp 98.4 F (36.9 C) (Oral)  Resp 19  SpO2 99%  Physical Exam  Nursing note and vitals reviewed. Constitutional: He is oriented to person, place, and time. He appears well-developed. No distress.  HENT:  Head: Normocephalic and atraumatic.  Eyes: Conjunctivae and EOM are normal.  Cardiovascular: Normal rate and regular rhythm.   Pulmonary/Chest: Effort normal. No stridor. No respiratory distress.  Abdominal: He exhibits no distension.  Musculoskeletal: He exhibits no  edema.  Neurological: He is alert and oriented to person, place, and time.  Skin: Skin is warm and dry.  Psychiatric: He has a normal mood and affect.    ED Course  Procedures (including critical care time)  Labs Reviewed  URINALYSIS, ROUTINE W REFLEX MICROSCOPIC - Abnormal; Notable for the following:    APPearance TURBID (*)     All other components within normal limits  URINE MICROSCOPIC-ADD ON  BASIC METABOLIC PANEL  CBC WITH DIFFERENTIAL   No results found.   No diagnosis found.  Oxygen 98% room air normal  12:59 AM The patient is asleep MDM  This generally well young male, presents with concerns of headache, abdominal pain, hypertension.  On exam the patient is in no distress with a  largely reassuring exam.  Patient is of mild epigastric discomfort, given his use of NSAID and there suspicion for drug related gastric changes. The patient describes a headache, has no evidence of neurologic dysfunction, nor overt stigmata of bleed.  Following ED medications the patient was sleeping.  The patient's labs notable mostly for mild hypokalemia.  The patient has some hypertension here, the patient new medication as he has close followup available.  Given the resolution of symptoms is appropriate to continue evaluation of his concerns as an outpatient.     Gerhard Munch, MD 04/03/12 0100

## 2012-04-02 NOTE — ED Notes (Signed)
Pt was seen by PCP last week and is keeping an eye on pt blood pressure. Pt is to follow up with him in 2 weeks.

## 2012-04-03 LAB — BASIC METABOLIC PANEL
CO2: 31 mEq/L (ref 19–32)
Chloride: 101 mEq/L (ref 96–112)
Creatinine, Ser: 1.21 mg/dL (ref 0.50–1.35)
Glucose, Bld: 103 mg/dL — ABNORMAL HIGH (ref 70–99)

## 2012-04-03 MED ORDER — POTASSIUM CHLORIDE CRYS ER 20 MEQ PO TBCR
40.0000 meq | EXTENDED_RELEASE_TABLET | Freq: Once | ORAL | Status: AC
  Administered 2012-04-03: 40 meq via ORAL
  Filled 2012-04-03: qty 2

## 2012-04-03 NOTE — Progress Notes (Signed)
  Subjective:    Patient ID: Peter Bailey, male    DOB: 1986-04-11, 26 y.o.   MRN: [id number]  HPI  1. Bipolar d/o:  Patient with hx of bipolar disorder.  States he was dx in late elementary school.  Has been on multiple medications before, most recently diazepam to help with anxiety and anger.  He has also been on depakote but this made him "feel like a zombie".  Now he feels like he feels more depressed.  He has been going through a lot of "issues" with his girlfriend.  He typically experiences more manic episodes where he "feels like superman"  And does stupid things.  He has been in and out of prison frequently.  [facility] but states that he can not afford to go there.  He denies thoughts of hurting himself or anyone else at this time.    Review of Systems Per HPI    Objective:   Physical Exam  Constitutional: He appears well-nourished. No distress.  Neck: No thyromegaly present.  Cardiovascular: Normal rate and regular rhythm.   Psychiatric: His speech is normal and behavior is normal. Thought content normal. He expresses impulsivity. He exhibits a depressed mood (Tearful at times.).       Affect is mood congruent   PHQ-9: 19, with score of 1 for question 9       Assessment & Plan:

## 2012-04-03 NOTE — Assessment & Plan Note (Addendum)
Appears to be on depressive side of bipolar. Will start fluoxetine given current severity of depression.  Will likely need to be put back on a mood stabilizer once this is improving.  Given number for Dr. Pascal Lux as well as behavioral health.  F/u two weeks.

## 2012-04-11 ENCOUNTER — Encounter: Payer: Self-pay | Admitting: Family Medicine

## 2012-04-11 ENCOUNTER — Ambulatory Visit (INDEPENDENT_AMBULATORY_CARE_PROVIDER_SITE_OTHER): Payer: Self-pay | Admitting: Family Medicine

## 2012-04-11 VITALS — BP 147/83 | HR 80 | Ht 73.0 in | Wt 225.0 lb

## 2012-04-11 DIAGNOSIS — F319 Bipolar disorder, unspecified: Secondary | ICD-10-CM

## 2012-04-11 NOTE — Patient Instructions (Addendum)
Thank you for coming in today, it was good to see you I am glad the medication is working well for you. I recommend that you meet with our psychologist, Dr. Spero Geralds, for help dealing with your Bipolar disorder.  You can schedule an appointment with her by calling her directly at [phone] once you have the orange card.  I will see you back again in 3-4 weeks to be sure you are continuing to do well.

## 2012-04-15 NOTE — Progress Notes (Signed)
  Subjective:    Patient ID: Peter Bailey, male    DOB: 1985/08/10, 26 y.o.   MRN: [id number]  HPI 1. Bipolar d/o:  Following up for bipolar.  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    He states that he feels more "even" and that he does not feel like "crying all the time".  He does not feel like he is being pushed over into a manic phase.  He is still working on getting the orange card, and then plans to make an appointment with Dr. Pascal Lux.  Denies any racing thoughts, or thoughts of harming himself or anyone else.    Review of Systems Per HPI    Objective:   Physical Exam  Constitutional: He appears well-nourished.  Neurological: He is alert.  Psychiatric: He has a normal mood and affect. His behavior is normal. Judgment and thought content normal. His speech is not rapid and/or pressured and not delayed. He is not agitated and not aggressive.          Assessment & Plan:

## 2012-04-15 NOTE — Assessment & Plan Note (Signed)
Improved, i think he still needs to be on a mood stabilizer but he is hesitant right now because this medication seems to be working well and he has tried so many medications that have not worked.  Given dr. Mariane Duval number to call once he has orange card.

## 2012-05-02 ENCOUNTER — Ambulatory Visit: Payer: Self-pay | Admitting: Family Medicine

## 2012-05-14 ENCOUNTER — Ambulatory Visit: Payer: Self-pay | Admitting: Family Medicine

## 2013-03-24 ENCOUNTER — Emergency Department (HOSPITAL_COMMUNITY)
Admission: EM | Admit: 2013-03-24 | Discharge: 2013-03-24 | Disposition: A | Discharge status: Home / Self Care | Payer: Self-pay | Attending: Emergency Medicine | Admitting: Emergency Medicine

## 2013-03-24 ENCOUNTER — Encounter (HOSPITAL_COMMUNITY): Payer: Self-pay | Admitting: Family Medicine

## 2013-03-24 ENCOUNTER — Emergency Department (HOSPITAL_COMMUNITY): Payer: Self-pay

## 2013-03-24 DIAGNOSIS — S93409A Sprain of unspecified ligament of unspecified ankle, initial encounter: Secondary | ICD-10-CM | POA: Insufficient documentation

## 2013-03-24 DIAGNOSIS — Z8719 Personal history of other diseases of the digestive system: Secondary | ICD-10-CM | POA: Insufficient documentation

## 2013-03-24 DIAGNOSIS — J45901 Unspecified asthma with (acute) exacerbation: Secondary | ICD-10-CM | POA: Insufficient documentation

## 2013-03-24 DIAGNOSIS — Y9367 Activity, basketball: Secondary | ICD-10-CM | POA: Insufficient documentation

## 2013-03-24 DIAGNOSIS — Z8659 Personal history of other mental and behavioral disorders: Secondary | ICD-10-CM | POA: Insufficient documentation

## 2013-03-24 DIAGNOSIS — F17200 Nicotine dependence, unspecified, uncomplicated: Secondary | ICD-10-CM | POA: Insufficient documentation

## 2013-03-24 DIAGNOSIS — Y9239 Other specified sports and athletic area as the place of occurrence of the external cause: Secondary | ICD-10-CM | POA: Insufficient documentation

## 2013-03-24 DIAGNOSIS — X500XXA Overexertion from strenuous movement or load, initial encounter: Secondary | ICD-10-CM | POA: Insufficient documentation

## 2013-03-24 MED ORDER — HYDROCODONE-ACETAMINOPHEN 5-325 MG PO TABS
1.0000 | ORAL_TABLET | Freq: Once | ORAL | Status: AC
  Administered 2013-03-24: 1 via ORAL
  Filled 2013-03-24: qty 1

## 2013-03-24 MED ORDER — HYDROCODONE-ACETAMINOPHEN 5-325 MG PO TABS: 1.0000 | ORAL_TABLET | Freq: Four times a day (QID) | ORAL | Status: DC | PRN

## 2013-03-24 NOTE — ED Provider Notes (Signed)
Medical screening examination/treatment/procedure(s) were performed by non-physician practitioner and as supervising physician I was immediately available for consultation/collaboration.  Jonluke Cobbins M Prisma Decarlo, MD 03/24/13 2332 

## 2013-03-24 NOTE — ED Provider Notes (Signed)
CSN: [id number]     Arrival date & time 03/24/13  2105 History  This chart was scribed for non-physician practitioner, Earley Favor, FNP, working with [name], MD by Karle Plumber, ED scribe and Greggory Stallion, ED scribe. This patient was seen in room WTR8/WTR8 and the patient's care was started at 9:29 PM.    Chief Complaint  Patient presents with  . Ankle Injury    The history is provided by the patient. No language interpreter was used.   HPI Comments: Peter Bailey is a 27 y.o. male who presents to the Emergency Department complaining of constant, sudden onset left ankle pain after injuring about 45 minutes ago playing basketball. Pt denies taking any pain medications since the injury  Pt reports he got a hair-line fracture 2-3 years ago that has never healed.      Past Medical History  Diagnosis Date  . Asthma   . Bipolar 1 disorder   . Shortness of breath   . GERD (gastroesophageal reflux disease)     OTC meds   Past Surgical History  Procedure Laterality Date  . Appendectomy    . Mandible surgery    . Orif finger fracture  12/02/2011    Procedure: OPEN REDUCTION INTERNAL FIXATION (ORIF) METACARPAL (FINGER) FRACTURE;  Surgeon: Marlowe Shores, MD;  Location: [facility] OR;  Service: Orthopedics;  Laterality: Right;  right ring metatarsal   Family History  Problem Relation Age of Onset  . Anesthesia problems Neg Hx   . Hypotension Neg Hx   . Malignant hyperthermia Neg Hx   . Pseudochol deficiency Neg Hx    History  Substance Use Topics  . Smoking status: Current Every Day Smoker -- 0.50 packs/day for 10 years    Types: Cigarettes  . Smokeless tobacco: Never Used  . Alcohol Use: No    Review of Systems  Musculoskeletal: Positive for joint swelling and arthralgias.  Skin: Positive for color change (right foot).  All other systems reviewed and are negative.    Allergies  Ibuprofen  Home Medications   Current Outpatient Rx  Name  Route  Sig  Dispense   Refill  . FLUoxetine (PROZAC) 20 MG capsule   Oral   Take 1 capsule (20 mg total) by mouth daily.   30 capsule   1   . albuterol (VENTOLIN HFA) 108 (90 BASE) MCG/ACT inhaler   Inhalation   Inhale 2 puffs into the lungs every 4 (four) hours as needed. For cough and wheeze          . HYDROcodone-acetaminophen (NORCO/VICODIN) 5-325 MG per tablet   Oral   Take 1 tablet by mouth every 6 (six) hours as needed for pain (severe pain).   17 tablet   0    BP 116/71  Pulse 90  Temp(Src) 99.2 F (37.3 C) (Oral)  Resp 18  Ht 5\' 11"  (1.803 m)  Wt 240 lb (108.863 kg)  BMI 33.49 kg/m2  SpO2 100% Physical Exam  Nursing note and vitals reviewed. Constitutional: He is oriented to person, place, and time. He appears well-developed and well-nourished. No distress.  HENT:  Head: Normocephalic and atraumatic.  Eyes: EOM are normal.  Neck: Neck supple. No tracheal deviation present.  Cardiovascular: Normal rate.   Pulmonary/Chest: Effort normal. No respiratory distress.  Musculoskeletal: Normal range of motion.  Swelling to lateral malleolus Proximal lateral foot with slight discoloration and swelling  Neurological: He is alert and oriented to person, place, and time.  Skin: Skin  is warm and dry.  Psychiatric: He has a normal mood and affect. His behavior is normal.    ED Course  DIAGNOSTIC STUDIES: Oxygen Saturation is 100% on RA, normal by my interpretation.    COORDINATION OF CARE: 9:35 PM-Discussed treatment plan which includes XRAY with pt at bedside and pt agreed to plan.     Procedures (including critical care time)  Labs Reviewed - No data to display Dg Ankle Complete Right  03/24/2013   *RADIOLOGY REPORT*  Clinical Data: Twisted ankle playing basketball  RIGHT ANKLE - COMPLETE 3+ VIEW  Comparison: 01/24/2010; 06/26/2007; right foot radiographs - earlier same day  Findings:  No fracture or dislocation.  The ankle mortise is preserved.  Joint spaces are preserved.  No ankle  joint effusion. Regional soft tissues are normal.  No radiopaque foreign body.  IMPRESSION: No fracture or dislocation.   Original Report Authenticated By: Tacey Ruiz, MD   Dg Foot Complete Right  03/24/2013   *RADIOLOGY REPORT*  Clinical Data: Twisted right ankle playing basketball, now with right ankle and foot pain  RIGHT FOOT COMPLETE - 3+ VIEW  Comparison: Right ankle radiographs - earlier same day; 01/24/2010  Findings: No fracture or dislocation.  Joint spaces are preserved. No erosions. Incidental note is made of an os peroneus.  Regional soft tissues are normal.  No radiopaque foreign body.  IMPRESSION: No fracture or dislocation.   Original Report Authenticated By: Tacey Ruiz, MD   1. Ankle sprain and strain, right, initial encounter     MDM   X-rays, reviewed.  No fracture.  Patient will be placed in ace wrap, as well as an ASO provided with pain medication, and a work excuse for 3, days.  Recommend follow up with Dr. Lajoyce Corners from orthopedics   I personally performed the services described in this documentation, which was scribed in my presence. The recorded information has been reviewed and is accurate.   Arman Filter, NP 03/24/13 2228

## 2013-03-24 NOTE — ED Notes (Signed)
Patient states that he was playing basketball and landed wrong on his ankle. States he rolled his right ankle. Also c/o pain in his left ankle. Reports that he has a history of "ankle problems."

## 2014-02-16 ENCOUNTER — Emergency Department (HOSPITAL_COMMUNITY): Payer: Self-pay

## 2014-02-16 ENCOUNTER — Encounter (HOSPITAL_COMMUNITY): Payer: Self-pay | Admitting: Emergency Medicine

## 2014-02-16 ENCOUNTER — Inpatient Hospital Stay (HOSPITAL_COMMUNITY)
Admission: EM | Admit: 2014-02-16 | Discharge: 2014-02-18 | DRG: 203 | Disposition: A | Discharge status: Home / Self Care | Payer: Self-pay | Attending: Family Medicine | Admitting: Family Medicine

## 2014-02-16 DIAGNOSIS — F319 Bipolar disorder, unspecified: Secondary | ICD-10-CM | POA: Diagnosis present

## 2014-02-16 DIAGNOSIS — F17200 Nicotine dependence, unspecified, uncomplicated: Secondary | ICD-10-CM | POA: Diagnosis present

## 2014-02-16 DIAGNOSIS — J45909 Unspecified asthma, uncomplicated: Secondary | ICD-10-CM

## 2014-02-16 DIAGNOSIS — Z886 Allergy status to analgesic agent status: Secondary | ICD-10-CM

## 2014-02-16 DIAGNOSIS — K219 Gastro-esophageal reflux disease without esophagitis: Secondary | ICD-10-CM | POA: Diagnosis present

## 2014-02-16 DIAGNOSIS — J45901 Unspecified asthma with (acute) exacerbation: Principal | ICD-10-CM | POA: Diagnosis present

## 2014-02-16 DIAGNOSIS — E876 Hypokalemia: Secondary | ICD-10-CM | POA: Diagnosis present

## 2014-02-16 LAB — BASIC METABOLIC PANEL
Anion gap: 21 — ABNORMAL HIGH (ref 5–15)
BUN: 13 mg/dL (ref 6–23)
CALCIUM: 8.9 mg/dL (ref 8.4–10.5)
CO2: 22 mEq/L (ref 19–32)
Chloride: 98 mEq/L (ref 96–112)
Creatinine, Ser: 1.14 mg/dL (ref 0.50–1.35)
GFR calc Af Amer: >90 mL/min (ref >90)
GFR calc non Af Amer: 86 mL/min — ABNORMAL LOW (ref >90)
GLUCOSE: 180 mg/dL — AB (ref 70–99)
POTASSIUM: 3 meq/L — AB (ref 3.7–5.3)
Sodium: 141 mEq/L (ref 137–147)

## 2014-02-16 LAB — CBC WITH DIFFERENTIAL/PLATELET
Basophils Absolute: 0 K/uL (ref 0.0–0.1)
Basophils Relative: 0 % (ref 0–1)
Eosinophils Absolute: 0 K/uL (ref 0.0–0.7)
Eosinophils Relative: 0 % (ref 0–5)
HCT: 39.2 % (ref 39.0–52.0)
Hemoglobin: 13.3 g/dL (ref 13.0–17.0)
Lymphocytes Relative: 4 % — ABNORMAL LOW (ref 12–46)
Lymphs Abs: 0.6 K/uL — ABNORMAL LOW (ref 0.7–4.0)
MCH: 30.6 pg (ref 26.0–34.0)
MCHC: 33.9 g/dL (ref 30.0–36.0)
MCV: 90.1 fL (ref 78.0–100.0)
Monocytes Absolute: 0.5 K/uL (ref 0.1–1.0)
Monocytes Relative: 4 % (ref 3–12)
Neutro Abs: 13.5 K/uL — ABNORMAL HIGH (ref 1.7–7.7)
Neutrophils Relative %: 92 % — ABNORMAL HIGH (ref 43–77)
Platelets: 248 K/uL (ref 150–400)
RBC: 4.35 MIL/uL (ref 4.22–5.81)
RDW: 13.2 % (ref 11.5–15.5)
WBC: 14.7 K/uL — ABNORMAL HIGH (ref 4.0–10.5)

## 2014-02-16 MED ORDER — OXYCODONE-ACETAMINOPHEN 5-325 MG PO TABS
1.0000 | ORAL_TABLET | Freq: Once | ORAL | Status: AC
  Administered 2014-02-16: 1 via ORAL
  Filled 2014-02-16: qty 1

## 2014-02-16 MED ORDER — IPRATROPIUM BROMIDE 0.02 % IN SOLN
0.5000 mg | Freq: Once | RESPIRATORY_TRACT | Status: AC
  Administered 2014-02-16: 0.5 mg via RESPIRATORY_TRACT
  Filled 2014-02-16: qty 2.5

## 2014-02-16 MED ORDER — SODIUM CHLORIDE 0.9 % IV BOLUS (SEPSIS)
1000.0000 mL | Freq: Once | INTRAVENOUS | Status: AC
  Administered 2014-02-16: 1000 mL via INTRAVENOUS

## 2014-02-16 MED ORDER — SODIUM CHLORIDE 0.9 % IV SOLN
INTRAVENOUS | Status: DC
  Administered 2014-02-16: 23:00:00 via INTRAVENOUS

## 2014-02-16 MED ORDER — PREDNISONE 20 MG PO TABS
60.0000 mg | ORAL_TABLET | Freq: Once | ORAL | Status: AC
  Administered 2014-02-16: 60 mg via ORAL
  Filled 2014-02-16: qty 3

## 2014-02-16 MED ORDER — ACETAMINOPHEN 325 MG PO TABS: 650.0000 mg | ORAL_TABLET | Freq: Four times a day (QID) | ORAL | Status: DC | PRN

## 2014-02-16 MED ORDER — MAGNESIUM SULFATE 40 MG/ML IJ SOLN
2.0000 g | Freq: Once | INTRAMUSCULAR | Status: AC
  Administered 2014-02-16: 2 g via INTRAVENOUS
  Filled 2014-02-16: qty 50

## 2014-02-16 MED ORDER — IPRATROPIUM-ALBUTEROL 0.5-2.5 (3) MG/3ML IN SOLN
3.0000 mL | Freq: Once | RESPIRATORY_TRACT | Status: AC
  Administered 2014-02-16: 3 mL via RESPIRATORY_TRACT
  Filled 2014-02-16: qty 3

## 2014-02-16 MED ORDER — HYDROMORPHONE HCL PF 1 MG/ML IJ SOLN
1.0000 mg | Freq: Once | INTRAMUSCULAR | Status: AC
  Administered 2014-02-16: 1 mg via INTRAMUSCULAR
  Filled 2014-02-16: qty 1

## 2014-02-16 MED ORDER — ACETAMINOPHEN 650 MG RE SUPP: 650.0000 mg | Freq: Four times a day (QID) | RECTAL | Status: DC | PRN

## 2014-02-16 MED ORDER — HYDROCODONE-ACETAMINOPHEN 5-325 MG PO TABS: 2.0000 | ORAL_TABLET | Freq: Once | ORAL | Status: DC

## 2014-02-16 MED ORDER — ALBUTEROL (5 MG/ML) CONTINUOUS INHALATION SOLN
10.0000 mg/h | INHALATION_SOLUTION | Freq: Once | RESPIRATORY_TRACT | Status: AC
  Administered 2014-02-16: 10 mg/h via RESPIRATORY_TRACT

## 2014-02-16 MED ORDER — HEPARIN SODIUM (PORCINE) 5000 UNIT/ML IJ SOLN
5000.0000 [IU] | Freq: Three times a day (TID) | INTRAMUSCULAR | Status: DC
  Administered 2014-02-16 – 2014-02-17 (×3): 5000 [IU] via SUBCUTANEOUS
  Filled 2014-02-16 (×4): qty 1

## 2014-02-16 MED ORDER — POTASSIUM CHLORIDE CRYS ER 20 MEQ PO TBCR
40.0000 meq | EXTENDED_RELEASE_TABLET | Freq: Two times a day (BID) | ORAL | Status: AC
Start: 2014-02-16 — End: 2014-02-17
  Administered 2014-02-16 – 2014-02-17 (×3): 40 meq via ORAL
  Filled 2014-02-16 (×3): qty 2

## 2014-02-16 MED ORDER — ALBUTEROL (5 MG/ML) CONTINUOUS INHALATION SOLN
10.0000 mg/h | INHALATION_SOLUTION | Freq: Once | RESPIRATORY_TRACT | Status: AC
  Administered 2014-02-16: 10 mg/h via RESPIRATORY_TRACT
  Filled 2014-02-16: qty 20

## 2014-02-16 MED ORDER — IPRATROPIUM-ALBUTEROL 0.5-2.5 (3) MG/3ML IN SOLN: 3.0000 mL | RESPIRATORY_TRACT | Status: DC | PRN

## 2014-02-16 MED ORDER — OXYCODONE HCL 5 MG PO TABS
5.0000 mg | ORAL_TABLET | ORAL | Status: DC | PRN
  Administered 2014-02-16 – 2014-02-17 (×4): 5 mg via ORAL
  Filled 2014-02-16 (×4): qty 1

## 2014-02-16 MED ORDER — SODIUM CHLORIDE 0.9 % IJ SOLN
3.0000 mL | Freq: Two times a day (BID) | INTRAMUSCULAR | Status: DC
  Administered 2014-02-16 – 2014-02-17 (×3): 3 mL via INTRAVENOUS

## 2014-02-16 MED ORDER — IPRATROPIUM-ALBUTEROL 0.5-2.5 (3) MG/3ML IN SOLN
3.0000 mL | RESPIRATORY_TRACT | Status: DC
  Administered 2014-02-16 – 2014-02-17 (×3): 3 mL via RESPIRATORY_TRACT
  Filled 2014-02-16 (×3): qty 3

## 2014-02-16 NOTE — H&P (Signed)
[facility] History and Physical Service Pager: [phone]  Patient name: Peter Bailey Medical record number: [id number] Date of birth: 03/11/1986 Age: 28 y.o. Gender: male  Primary Care Provider: Denny LevySara Neal, MD Consultants: none Code Status: Full  Chief Complaint: SOB  Assessment and Plan: Peter Bailey is a 28 y.o. male presenting with intractable shortness of breath. PMH is significant for asthma (intermittent), bipolar (not otherwise specified), smoker (0.5-1 pack per day)  # Acute asthma exacerbation: Patient's shortness of breath is likely due to an acute asthma exacerbation (patient history, patient report, CXR positive for hyperinflation compared to CXR on 12/01/11, CXR shows no acute pulmonary abnormality); however productive cough and recent WBC level of 14.7 could be indicative of infectious cause. Patient reporting right sided chest pain but Well's score 0.  - Patient admitted to step down, Dr. Deirdre Priesthambliss attending  - DuoNeb Q2/Q1 PRN, transition to Q4/Q2 PRN  -  Prednisone 60mg  PO daily  - pulse OX   #Hypokalemia: most likely related to albuterol. Hx of being low at 3.3.  - BMP AM - Kdur 40 mg x 3 doses   #Elevated Anion Gap: most likely related to lactate as poor perfusion related to asthma exacerbation. Renal function is normal. No signs of any ingestion. Would expect AG to be lower due if he was compliant with Dierdre SearlesLi.  - BMP  - NS 100 mL/hr.   # Bipolar disorder (NOS): reports to taking it his medication last about two weeks ago. Appears stable with no signs of mania or depression.  - Holding medication due to self-reported noncompliance for 3-4 weeks. - Lithium level: pending  - TSH: pending  - BMP: pending   #Tobacco abuse: still smoking about 0.5 PPD.  - encourage to quit with asthma   FEN/GI: Regular diet Prophylaxis: Subcutaneous heparin  Disposition: Admitted to step down; pending improvement.   History of Present  Illness: Peter Bailey is a 28 y.o. male presenting with shortness of breath refractory to home asthma medication. Patient states that 2 days ago he fell and bruised a rib. He was at this point but he noticed it started coughing, coughing gradually became more progressive. Cough is currently productive. He began feeling short of breath. Yesterday he used his albuterol inhaler 4 times without relief. Cough and shortness of breath lasted all night and inhibits his sleep. Patient states it is extremely athletic however due to his current status he has become increasingly short of breath even when walking to the restroom.  Patient denies any asthma attack as recent as 3 years. He states that he is only been admitted to the hospital for asthma related issues when he was a young child but never as an adult. He states that he uses his albuterol very infrequently. His known triggers are dust and congestion (URI). He is currently unsure about any recent infectious contacts. He denies any recent travel, no recent extremity swelling. Patient denies any recent nausea vomiting or diarrhea. No dysuria. Patient is a current smoker; smoking a half pack to a full pack a day. He denies any illicit drug use with the exception of inconsistent marijuana use. He works in [employer] which he states he is regularly exposed to various dusts and small particles. However he states that this has not been an issue lately. Patient states that he has an allergy to ibuprofen, which causes asthmatic flares. He also states that he is currently prescribed lithium however he has not been taking  lithium for the past 3-4 weeks due to running out of his prescription. Otherwise no recent medication changes.  Patient was admitted to step down on observation.  Review Of Systems: Per HPI Otherwise 12 point review of systems was performed and was unremarkable.  Patient Active Problem List   Diagnosis Date Noted  . Asthma exacerbation  02/16/2014  . URI (upper respiratory infection) 05/30/2011  . Left ankle sprain 03/22/2011  . Low back pain 03/22/2011  . ASTHMA, INTERMITTENT, MILD 04/19/2010  . TOBACCO USER 05/09/2009  . DISORDER, BIPOLAR NOS 04/05/2007   Past Medical History: Past Medical History  Diagnosis Date  . Asthma   . Bipolar 1 disorder   . Shortness of breath   . GERD (gastroesophageal reflux disease)     OTC meds   Past Surgical History: Past Surgical History  Procedure Laterality Date  . Appendectomy    . Mandible surgery    . Orif finger fracture  12/02/2011    Procedure: OPEN REDUCTION INTERNAL FIXATION (ORIF) METACARPAL (FINGER) FRACTURE;  Surgeon: Marlowe Shores, MD;  Location: [facility] OR;  Service: Orthopedics;  Laterality: Right;  right ring metatarsal   Social History: History  Substance Use Topics  . Smoking status: Current Every Day Smoker -- 0.50 packs/day for 10 years    Types: Cigarettes  . Smokeless tobacco: Never Used  . Alcohol Use: No   Additional social history: None Please also refer to relevant sections of EMR.  Family History: Family History  Problem Relation Age of Onset  . Anesthesia problems Neg Hx   . Hypotension Neg Hx   . Malignant hyperthermia Neg Hx   . Pseudochol deficiency Neg Hx    Allergies and Medications: Allergies  Allergen Reactions  . Ibuprofen Other (See Comments)    Eyes and throat swell   No current facility-administered medications on file prior to encounter.   Current Outpatient Prescriptions on File Prior to Encounter  Medication Sig Dispense Refill  . [DISCONTINUED] diazepam (VALIUM) 5 MG tablet Take 1 tablet (5 mg total) by mouth every 8 (eight) hours as needed for anxiety or sleep.  30 tablet  0    Objective: BP 100/52  Pulse 119  Temp(Src) 98.5 F (36.9 C)  Resp 20  Ht 6' (1.829 m)  Wt 220 lb (99.791 kg)  BMI 29.83 kg/m2  SpO2 98% Exam: General -- oriented x3, pleasant and cooperative. HEENT -- Head is normocephalic. Eyes,  pupils equal and round and react to light and accommodation. Extraocular motions are intact. Neck -- supple Integument -- intact. No rash, erythema, or ecchymoses.  Chest -- good expansion. Bilateral wheezes heard throughout. prolonged exp phase, no tenderness to palpation .  Cardiac -- Regular rhythm, tachycardic. No murmurs noted.  Abdomen -- soft, nontender. No masses palpable. Normal bowel sounds present. Musculoskeletal - no tenderness or effusions noted. ROM good. 5/5 bilateral strength.  Ext: Dorsalis pedis pulses present and symmetrical.    Labs and Imaging: CBC BMET   Recent Labs Lab 02/16/14 1839  WBC 14.7*  HGB 13.3  HCT 39.2  PLT 248    Recent Labs Lab 02/16/14 1839  NA 141  K 3.0*  CL 98  CO2 22  BUN 13  CREATININE 1.14  GLUCOSE 180*  CALCIUM 8.9     Chest x-ray (7/19) IMPRESSION:  No acute cardiopulmonary abnormality seen.    Kathee Delton, MD 02/16/2014, 8:17 PM PGY-1, [facility] FPTS Intern pager: [phone], text pages welcome  Upper Level Addendum:  I have seen and evaluated this patient along with Dr. Wende Mott and reviewed the above note, making necessary revisions in [facility].   Clare Gandy, MD Family Medicine PGY-2

## 2014-02-16 NOTE — ED Notes (Signed)
Attempted report 

## 2014-02-16 NOTE — ED Notes (Signed)
Attempted report, per request of 2CRN, confirmed with Dr. Criss AlvineGoldston, Admitting MD, Flow Coordinator, and RRRN Nehemiah SettleBrooke that patient is appropriate for step down bed.

## 2014-02-16 NOTE — ED Provider Notes (Signed)
CSN: [id number]     Arrival date & time 02/16/14  1245 History   First MD Initiated Contact with Patient 02/16/14 1446     Chief Complaint  Patient presents with  . Asthma  . Shortness of Breath  . Chest Pain     (Consider location/radiation/quality/duration/timing/severity/associated sxs/prior Treatment) HPI 28 year old male presents with cough, shortness of breath, and chest pain over the last 3-4 days. He states is also been having nasal congestion. Has felt hot but has not checked his temperature. He is also been having chest pain, worst on the right side. He also fell right before this started and has an abrasion to his right abdomen and pain in his right wrist. Coughing makes the pain worse. He tried his albuterol inhaler with temporary relief. He tried his grandmother's albuterol nebulizer this morning with minimal relief. The patient is feeling like his chest is tight is having a tough time breathing.  Past Medical History  Diagnosis Date  . Asthma   . Bipolar 1 disorder   . Shortness of breath   . GERD (gastroesophageal reflux disease)     OTC meds   Past Surgical History  Procedure Laterality Date  . Appendectomy    . Mandible surgery    . Orif finger fracture  12/02/2011    Procedure: OPEN REDUCTION INTERNAL FIXATION (ORIF) METACARPAL (FINGER) FRACTURE;  Surgeon: Marlowe Shores, MD;  Location: [facility] OR;  Service: Orthopedics;  Laterality: Right;  right ring metatarsal   Family History  Problem Relation Age of Onset  . Anesthesia problems Neg Hx   . Hypotension Neg Hx   . Malignant hyperthermia Neg Hx   . Pseudochol deficiency Neg Hx    History  Substance Use Topics  . Smoking status: Current Every Day Smoker -- 0.50 packs/day for 10 years    Types: Cigarettes  . Smokeless tobacco: Never Used  . Alcohol Use: No    Review of Systems  Constitutional: Negative for fever.  HENT: Positive for congestion.   Respiratory: Positive for cough and shortness of breath.    Cardiovascular: Positive for chest pain. Negative for leg swelling.  Gastrointestinal: Negative for vomiting and abdominal pain.  Musculoskeletal: Positive for back pain (when coughing).  All other systems reviewed and are negative.     Allergies  Ibuprofen  Home Medications   Prior to Admission medications   Medication Sig Start Date End Date Taking? Authorizing Provider  albuterol (VENTOLIN HFA) 108 (90 BASE) MCG/ACT inhaler Inhale 2 puffs into the lungs every 4 (four) hours as needed. For cough and wheeze     Historical Provider, MD  FLUoxetine (PROZAC) 20 MG capsule Take 1 capsule (20 mg total) by mouth daily. 03/28/12 03/28/13  Everrett Coombe, DO  HYDROcodone-acetaminophen (NORCO/VICODIN) 5-325 MG per tablet Take 1 tablet by mouth every 6 (six) hours as needed for pain (severe pain). 03/24/13   Arman Filter, NP   BP 117/71  Pulse 97  Temp(Src) 98.5 F (36.9 C)  Resp 20  Ht 6' (1.829 m)  Wt 220 lb (99.791 kg)  BMI 29.83 kg/m2  SpO2 97% Physical Exam  Nursing note and vitals reviewed. Constitutional: He is oriented to person, place, and time. He appears well-developed and well-nourished. No distress.  HENT:  Head: Normocephalic and atraumatic.  Right Ear: External ear normal.  Left Ear: External ear normal.  Nasal congestion  Eyes: Right eye exhibits no discharge. Left eye exhibits no discharge.  Neck: Neck supple.  Cardiovascular: Normal rate, regular  rhythm, normal heart sounds and intact distal pulses.   Pulmonary/Chest: Effort normal. He has wheezes (diffuse expiratory wheezes). He has no rales. He exhibits tenderness (right lower chest wall).  Abdominal: Soft. There is no tenderness.  Small superficial abrasions to right abdomen  Musculoskeletal: He exhibits no edema.  Neurological: He is alert and oriented to person, place, and time.  Skin: Skin is warm and dry.    ED Course  Procedures (including critical care time) Labs Review Labs Reviewed  CBC WITH  DIFFERENTIAL - Abnormal; Notable for the following:    WBC 14.7 (*)    Neutrophils Relative % 92 (*)    Neutro Abs 13.5 (*)    Lymphocytes Relative 4 (*)    Lymphs Abs 0.6 (*)    All other components within normal limits  BASIC METABOLIC PANEL - Abnormal; Notable for the following:    Potassium 3.0 (*)    Glucose, Bld 180 (*)    GFR calc non Af Amer 86 (*)    Anion gap 21 (*)    All other components within normal limits  MRSA PCR SCREENING  LITHIUM LEVEL  TSH  BASIC METABOLIC PANEL  CBC    Imaging Review Dg Chest 2 View (if Patient Has Fever And/or Copd)  02/16/2014   CLINICAL DATA:  Chest pain, shortness of breath.  EXAM: CHEST  2 VIEW  COMPARISON:  Dec 01, 2011.  FINDINGS: The heart size and mediastinal contours are within normal limits. Both lungs are clear. No pneumothorax or pleural effusion is noted. The visualized skeletal structures are unremarkable.  IMPRESSION: No acute cardiopulmonary abnormality seen.   Electronically Signed   By: Roque LiasJames  Green M.D.   On: 02/16/2014 14:15     EKG Interpretation None      CRITICAL CARE Performed by: Pricilla LovelessGOLDSTON, Raylen Tangonan T   Total critical care time: 30 minutes  Critical care time was exclusive of separately billable procedures and treating other patients.  Critical care was necessary to treat or prevent imminent or life-threatening deterioration.  Critical care was time spent personally by me on the following activities: development of treatment plan with patient and/or surrogate as well as nursing, discussions with consultants, evaluation of patient's response to treatment, examination of patient, obtaining history from patient or surrogate, ordering and performing treatments and interventions, ordering and review of laboratory studies, ordering and review of radiographic studies, pulse oximetry and re-evaluation of patient's condition.  MDM   Final diagnoses:  Asthma exacerbation    Patient with an asthma exacerbation. Maintaining  airway, no distress. Somewhat improved after duoneb in triage and continuous albuterol, but still with wheezing, borderline sats and dyspnea. Given his diffuse wheezing and URI like symptoms, I highly doubt PE, ACS or other cause of dyspnea. Will place on another continuous, will need stepdown due to frequency of treatments. Stable for admission.    Audree CamelScott T Azaliah Carrero, MD 02/16/14 2115

## 2014-02-16 NOTE — ED Notes (Signed)
Admitting at bedside 

## 2014-02-16 NOTE — ED Notes (Signed)
RT notified of continuous neb order.

## 2014-02-16 NOTE — ED Notes (Signed)
Pt c/o asthma exacerbation and chest pain. Pt has tried inhalers and neb treatment at home without relief.

## 2014-02-17 LAB — LITHIUM LEVEL

## 2014-02-17 LAB — BASIC METABOLIC PANEL
Anion gap: 16 — ABNORMAL HIGH (ref 5–15)
BUN: 13 mg/dL (ref 6–23)
CHLORIDE: 101 meq/L (ref 96–112)
CO2: 23 mEq/L (ref 19–32)
Calcium: 8.8 mg/dL (ref 8.4–10.5)
Creatinine, Ser: 1 mg/dL (ref 0.50–1.35)
GFR calc non Af Amer: >90 mL/min (ref >90)
Glucose, Bld: 141 mg/dL — ABNORMAL HIGH (ref 70–99)
POTASSIUM: 3.8 meq/L (ref 3.7–5.3)
Sodium: 140 mEq/L (ref 137–147)

## 2014-02-17 LAB — CBC
HCT: 37.5 % — ABNORMAL LOW (ref 39.0–52.0)
HEMOGLOBIN: 12.6 g/dL — AB (ref 13.0–17.0)
MCH: 30.1 pg (ref 26.0–34.0)
MCHC: 33.6 g/dL (ref 30.0–36.0)
MCV: 89.7 fL (ref 78.0–100.0)
Platelets: 263 10*3/uL (ref 150–400)
RBC: 4.18 MIL/uL — AB (ref 4.22–5.81)
RDW: 13.6 % (ref 11.5–15.5)
WBC: 12.5 10*3/uL — ABNORMAL HIGH (ref 4.0–10.5)

## 2014-02-17 LAB — TSH: TSH: 0.925 u[IU]/mL (ref 0.350–4.500)

## 2014-02-17 LAB — MRSA PCR SCREENING: MRSA by PCR: NEGATIVE

## 2014-02-17 MED ORDER — IPRATROPIUM-ALBUTEROL 0.5-2.5 (3) MG/3ML IN SOLN: 3.0000 mL | RESPIRATORY_TRACT | Status: DC | PRN

## 2014-02-17 MED ORDER — IPRATROPIUM-ALBUTEROL 0.5-2.5 (3) MG/3ML IN SOLN
3.0000 mL | RESPIRATORY_TRACT | Status: DC
  Administered 2014-02-17 – 2014-02-18 (×5): 3 mL via RESPIRATORY_TRACT
  Filled 2014-02-17 (×5): qty 3

## 2014-02-17 NOTE — Progress Notes (Signed)
Utilization review completed. Ladainian Therien, RN, BSN. 

## 2014-02-17 NOTE — Progress Notes (Signed)
Pt pulled out IV. Anticipating d.c tomorrow. Patient refusing to place peripheral IV at this time. MD aware.

## 2014-02-17 NOTE — Progress Notes (Signed)
MD contacted to confirm that tele orders can be discontinued.  Awaiting call back - pt states he's "feeling great", ambulating, no c/o n/v, dizziness or weakness - exp wheezes and productive cough continued - remains on RA with O2 stat 97% or greater.  Will continue to monitor.
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
## 2014-02-17 NOTE — Progress Notes (Signed)
Received return call from MD - ok to d/c tele per verbal order.  Pt updated of transfer to 5W - no questions or concerns at this time.  CCMD contacted to advise that pt would be taken off tele and report given to 5W RN.   Pt walking the unit - will transfer pt via wheelchair with belongings, meds and chart.  No acute issues upon transfer.

## 2014-02-17 NOTE — H&P (Signed)
Family Medicine Teaching Service Attending Note  I interviewed and examined patient Peter Bailey and reviewed their tests and x-rays.  I discussed with Dr. Jordan LikesSchmitz and reviewed their note for today.  I agree with their assessment and plan.     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

## 2014-02-17 NOTE — Progress Notes (Addendum)
FMTS Attending Daily Note:  Peter DonJeff [name] MD  [phone] pager  Family Practice pager:  [phone] I have discussed this patient with the resident Dr. Wende Bailey and attending physician Dr. Deirdre Bailey.  I agree with their findings, assessment, and care plan.  No clear indication for narcotics -- will DC. Also, rather hyperinflated lungs to be so young without any straightforward asthma exacerbation for year.  Recommend PFTs +/- wu for alpha-1-antitryptsan deficiency as outpatient.

## 2014-02-17 NOTE — Progress Notes (Signed)
Family Medicine Teaching Service Daily Progress Note Intern Pager: [phone]  Patient name: Peter CarnesJoshua A Bailey Medical record number: [id number] Date of birth: 10/09/1985 Age: 28 y.o. Gender: male  Primary Care Provider: Myra RudeSchmitz, Jeremy E, MD Consultants: None Code Status: Full  Pt Overview and Major Events to Date:  7/19: admitted w/ SOB, asthma exacerbation. 7/20: Status improved. Some continued SOB and dry cough  Assessment and Plan: Peter CarnesJoshua A Aboud is a 28 y.o. male presenting with intractable shortness of breath. PMH is significant for asthma (intermittent), bipolar (not otherwise specified), smoker (0.5-1 pack per day)   # Acute asthma exacerbation: Patient's shortness of breath is likely due to an acute asthma exacerbation (patient history, patient report, CXR positive for hyperinflation compared to CXR on 12/01/11, CXR shows no acute pulmonary abnormality); however productive cough and recent WBC level of 14.7 (7/20 12.5) could be indicative of infectious cause. Patient reporting right sided chest pain but Well's score 0.  - DuoNeb Q2/Q1 PRN, transition to Q4/Q2 PRN  - Prednisone 60mg  PO daily  - pulse OX; Patient Transferred out of stepdown.  #Hypokalemia: most likely related to albuterol. Hx of being low at 3.3.  - BMP 7/20: 3.8 - Kdur 40 mg x 3 doses (7/19)  #Elevated Anion Gap: most likely related to lactate as poor perfusion related to asthma exacerbation. Renal function is normal. No signs of any ingestion. Would expect AG to be lower if he was compliant with Dierdre SearlesLi.  - BMP 21>>16 - NS 100 mL/hr.   # Bipolar disorder (NOS): reports to taking it his medication last about two weeks ago. Appears stable with no signs of mania or depression.  - Holding medication due to self-reported noncompliance for 3-4 weeks.  - Lithium level: <0.25 - TSH: 0.925 (wnl) - BMP: AG 16, Glc 141, all others wnl  #Tobacco abuse: still smoking about 0.5 PPD.  - encourage to quit with asthma     FEN/GI: Regular Diet PPx: Heparin  Disposition: Home when medically stable - Followup with PCP for proper PFT (?Dr. Raymondo BandKoval) - Consider discharge with long and short acting inhaler - PCP should be encourage to consider rule out for Alpha-1 anti-trypsin deficiency  Subjective:  Patient reports that his breathing is significantly better than it was last night. He states that he continues to have some coughing fits which appear to be nonproductive. He is very grateful for the care he has received, but he states that he has not yet to his baseline. Patient also inquires about reports that he had low potassium, and he was interested in what this meant. I discussed with him at length the meaning of hypokalemia and why he gave him K-Dur.  Objective: Temp:  [97.6 F (36.4 C)-98.8 F (37.1 C)] 98.8 F (37.1 C) (07/20 0800) Pulse Rate:  [79-126] 95 (07/20 1213) Resp:  [16-36] 21 (07/20 1213) BP: (100-149)/(37-81) 107/37 mmHg (07/20 0906) SpO2:  [93 %-100 %] 97 % (07/20 1213) Physical Exam: General -- oriented x3, pleasant and cooperative.  HEENT -- Head is normocephalic. Chest -- good expansion. Bilateral wheezes heard throughout. prolonged exp phase. Improved since yesterday. no tenderness to palpation .  Cardiac -- Regular rhythm, tachycardic. No murmurs noted.  Abdomen -- soft, nontender. No masses palpable. Normal bowel sounds present.  Musculoskeletal - no tenderness or effusions noted Ext: Dorsalis pedis pulses present and symmetrical.    Laboratory:  Recent Labs Lab 02/16/14 1839 02/17/14 0245  WBC 14.7* 12.5*  HGB 13.3 12.6*  HCT 39.2 37.5*  PLT 248 263    Recent Labs Lab 02/16/14 1839 02/17/14 0245  NA 141 140  K 3.0* 3.8  CL 98 101  CO2 22 23  BUN 13 13  CREATININE 1.14 1.00  CALCIUM 8.9 8.8  GLUCOSE 180* 141*   Anion gap 16 TSH 0.925 Lithium <0.25   Imaging/Diagnostic Tests: CXR (7/19) IMPRESSION:  No acute cardiopulmonary abnormality seen.   Kathee Delton, MD 02/17/2014, 1:22 PM PGY-1, [facility] Family Medicine FPTS Intern pager: [phone], text pages welcome

## 2014-02-17 NOTE — Progress Notes (Signed)
Pt states he's "feeling much better" - that standing up and being in the chair is "helping my breathing".  Minimal c/o pain d/t coughing - PRN pain meds given per MD order - minimal thin secretions noted.  Contacted MD to request that pt be saline locked d/t excellent PO intake - confirmed and verbal order placed.  Pt currently visiting with family - will continue to monitor.

## 2014-02-17 NOTE — Progress Notes (Signed)
Pt transferred to 5W23 with all belongings. Alert and oriented. VSS. No pain presently. Call bell within reach.
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
## 2014-02-19 ENCOUNTER — Ambulatory Visit: Payer: Self-pay

## 2014-02-21 ENCOUNTER — Ambulatory Visit: Payer: Self-pay | Admitting: Family Medicine

## 2014-02-21 NOTE — Discharge Summary (Signed)
Family Medicine Teaching Service  Discharge Note : Attending Jeff Walden MD Pager [phone] Inpatient Team Pager:  [phone]  I have reviewed this patient and the patient's chart and have discussed discharge planning with the resident at the time of discharge. I agree with the discharge plan as above.    

## 2014-08-27 ENCOUNTER — Encounter: Payer: Self-pay | Admitting: Family Medicine

## 2014-08-27 ENCOUNTER — Ambulatory Visit (INDEPENDENT_AMBULATORY_CARE_PROVIDER_SITE_OTHER): Payer: Self-pay | Admitting: Family Medicine

## 2014-08-27 VITALS — BP 143/85 | HR 96 | Temp 98.7°F | Ht 72.0 in | Wt 238.2 lb

## 2014-08-27 DIAGNOSIS — J4520 Mild intermittent asthma, uncomplicated: Secondary | ICD-10-CM

## 2014-08-27 DIAGNOSIS — F3170 Bipolar disorder, currently in remission, most recent episode unspecified: Secondary | ICD-10-CM

## 2014-08-27 MED ORDER — ALBUTEROL SULFATE HFA 108 (90 BASE) MCG/ACT IN AERS: 2.0000 | INHALATION_SPRAY | RESPIRATORY_TRACT | Status: DC | PRN

## 2014-08-27 MED ORDER — FLUTICASONE PROPIONATE HFA 220 MCG/ACT IN AERO: 2.0000 | INHALATION_SPRAY | Freq: Two times a day (BID) | RESPIRATORY_TRACT | Status: AC

## 2014-08-27 NOTE — Assessment & Plan Note (Signed)
I will give him prescription for Flovent and albuterol. Discussed smoking cessation.

## 2014-08-27 NOTE — Progress Notes (Signed)
   Subjective:    Patient ID: Peter CarnesJoshua A Prindle, male    DOB: 04/15/1986, 29 y.o.   MRN: [id number]  HPI #1. Problems with asthma with a lot of cough and episodes of wheezing. He has been using his grandmothers albuterol nebulizer treatment when he needs one. He does not currently have any medicines of his own and does not have insurance and funds are limited. To know what is available at a low cost. Current t smoker. Has been hospitalized before but not intubated. Has not had fever. Today he's not having any significant wheezing. #2. History of bipolar disorder. He did not like the way he felt on lithium so he never took it rarely and has not had any in many months. Feels like he's having trouble sleeping at night and having a lot of mood lability. Denies suicidal or homicidal ideation. Denies hallucinations. Denies tearfulness and does not feel depressed. Does not think he's currently having a manic episode   Review of Systems No fever, sweats, chills, unusual weight change. No myalgias or arthralgias. See history of present illness above for additional pertinent review of systems.    Objective:   Physical Exam  Vital signs reviewed. GENERAL: Well-developed, well-nourished, no acute distress. CARDIOVASCULAR: Regular rate and rhythm no murmur gallop or rub LUNGS: Clear to auscultation bilaterally, occasional expiratory wheeze heard left base but mostly cleared with cough. ABDOMEN: Soft positive bowel sounds NEURO: No gross focal neurological deficits. MSK: Movement of extremity x 4. PSYCHIATRIC: Alert and oriented 4. Affect is interactive. Speech is normal in content and fluency. Denies hallucination. Asks and answers questions appropriately. Neatly dressed. Good eye contact. Is here with his grandmother and they carry on normal conversation appear to have normal interaction although he does appear to become a little bit agitated toward the end of the visit and is anxious to leave.         Assessment & Plan:

## 2014-08-27 NOTE — Assessment & Plan Note (Signed)
I think he would best be served by return to psychiatric care. There is nothing on the $4 medicine list or low cost medication that I can give him in the meantime that I think would be beneficial. He really wants something for sleep but I think that's part of his bipolar disorder areas [facility]. I think he was a little unhappy with this and became a little aggravated at the end of the visit they did not receive some type of prescription

## 2014-08-28 ENCOUNTER — Ambulatory Visit: Payer: Self-pay

## 2014-09-10 ENCOUNTER — Ambulatory Visit (INDEPENDENT_AMBULATORY_CARE_PROVIDER_SITE_OTHER): Payer: Self-pay | Admitting: Family Medicine

## 2014-09-10 ENCOUNTER — Encounter: Payer: Self-pay | Admitting: Family Medicine

## 2014-09-10 VITALS — BP 122/80 | HR 76 | Wt 232.5 lb

## 2014-09-10 DIAGNOSIS — S134XXA Sprain of ligaments of cervical spine, initial encounter: Secondary | ICD-10-CM

## 2014-09-10 MED ORDER — CYCLOBENZAPRINE HCL 5 MG PO TABS
5.0000 mg | ORAL_TABLET | Freq: Two times a day (BID) | ORAL | Status: DC
Start: 2014-09-10 — End: 2015-07-07
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
## 2015-04-27 ENCOUNTER — Emergency Department (HOSPITAL_COMMUNITY): Payer: Self-pay

## 2015-04-27 ENCOUNTER — Inpatient Hospital Stay (HOSPITAL_COMMUNITY)
Admission: EM | Admit: 2015-04-27 | Discharge: 2015-05-01 | DRG: 194 | Disposition: A | Discharge status: Home / Self Care | Payer: Self-pay | Attending: Family Medicine | Admitting: Family Medicine

## 2015-04-27 ENCOUNTER — Encounter (HOSPITAL_COMMUNITY): Payer: Self-pay | Admitting: Neurology

## 2015-04-27 DIAGNOSIS — F1193 Opioid use, unspecified with withdrawal: Secondary | ICD-10-CM | POA: Insufficient documentation

## 2015-04-27 DIAGNOSIS — R590 Localized enlarged lymph nodes: Secondary | ICD-10-CM | POA: Diagnosis present

## 2015-04-27 DIAGNOSIS — J322 Chronic ethmoidal sinusitis: Secondary | ICD-10-CM | POA: Diagnosis present

## 2015-04-27 DIAGNOSIS — Z79899 Other long term (current) drug therapy: Secondary | ICD-10-CM

## 2015-04-27 DIAGNOSIS — R7611 Nonspecific reaction to tuberculin skin test without active tuberculosis: Secondary | ICD-10-CM | POA: Insufficient documentation

## 2015-04-27 DIAGNOSIS — Z886 Allergy status to analgesic agent status: Secondary | ICD-10-CM

## 2015-04-27 DIAGNOSIS — J029 Acute pharyngitis, unspecified: Secondary | ICD-10-CM | POA: Insufficient documentation

## 2015-04-27 DIAGNOSIS — F1123 Opioid dependence with withdrawal: Secondary | ICD-10-CM | POA: Diagnosis present

## 2015-04-27 DIAGNOSIS — K219 Gastro-esophageal reflux disease without esophagitis: Secondary | ICD-10-CM | POA: Diagnosis present

## 2015-04-27 DIAGNOSIS — F558 Abuse of other non-psychoactive substances: Secondary | ICD-10-CM

## 2015-04-27 DIAGNOSIS — R9389 Abnormal findings on diagnostic imaging of other specified body structures: Secondary | ICD-10-CM

## 2015-04-27 DIAGNOSIS — F17210 Nicotine dependence, cigarettes, uncomplicated: Secondary | ICD-10-CM | POA: Diagnosis present

## 2015-04-27 DIAGNOSIS — J4520 Mild intermittent asthma, uncomplicated: Secondary | ICD-10-CM | POA: Diagnosis present

## 2015-04-27 DIAGNOSIS — F319 Bipolar disorder, unspecified: Secondary | ICD-10-CM | POA: Diagnosis present

## 2015-04-27 DIAGNOSIS — J189 Pneumonia, unspecified organism: Principal | ICD-10-CM | POA: Diagnosis present

## 2015-04-27 DIAGNOSIS — Z91018 Allergy to other foods: Secondary | ICD-10-CM

## 2015-04-27 LAB — BASIC METABOLIC PANEL
Anion gap: 10 (ref 5–15)
BUN: 10 mg/dL (ref 6–20)
CO2: 29 mmol/L (ref 22–32)
Calcium: 9.6 mg/dL (ref 8.9–10.3)
Chloride: 98 mmol/L — ABNORMAL LOW (ref 101–111)
Creatinine, Ser: 0.99 mg/dL (ref 0.61–1.24)
GFR calc Af Amer: >60 mL/min (ref >60)
GFR calc non Af Amer: >60 mL/min (ref >60)
Glucose, Bld: 116 mg/dL — ABNORMAL HIGH (ref 65–99)
Potassium: 4.5 mmol/L (ref 3.5–5.1)
Sodium: 137 mmol/L (ref 135–145)

## 2015-04-27 LAB — CBC WITH DIFFERENTIAL/PLATELET
Basophils Absolute: 0 10*3/uL (ref 0.0–0.1)
Basophils Relative: 0 %
Eosinophils Absolute: 0.1 10*3/uL (ref 0.0–0.7)
Eosinophils Relative: 1 %
HCT: 41.8 % (ref 39.0–52.0)
Hemoglobin: 14.1 g/dL (ref 13.0–17.0)
Lymphocytes Relative: 11 %
Lymphs Abs: 1.5 10*3/uL (ref 0.7–4.0)
MCH: 32.1 pg (ref 26.0–34.0)
MCHC: 33.7 g/dL (ref 30.0–36.0)
MCV: 95.2 fL (ref 78.0–100.0)
Monocytes Absolute: 0.8 10*3/uL (ref 0.1–1.0)
Monocytes Relative: 6 %
Neutro Abs: 11.2 10*3/uL — ABNORMAL HIGH (ref 1.7–7.7)
Neutrophils Relative %: 82 %
Platelets: 333 10*3/uL (ref 150–400)
RBC: 4.39 MIL/uL (ref 4.22–5.81)
RDW: 13.5 % (ref 11.5–15.5)
WBC: 13.6 10*3/uL — ABNORMAL HIGH (ref 4.0–10.5)

## 2015-04-27 LAB — RAPID HIV SCREEN (HIV 1/2 AB+AG)
HIV 1/2 Antibodies: NONREACTIVE
HIV-1 P24 Antigen - HIV24: NONREACTIVE

## 2015-04-27 LAB — RAPID STREP SCREEN (MED CTR MEBANE ONLY): Streptococcus, Group A Screen (Direct): NEGATIVE

## 2015-04-27 MED ORDER — FLUTICASONE PROPIONATE HFA 220 MCG/ACT IN AERO: 2.0000 | INHALATION_SPRAY | Freq: Two times a day (BID) | RESPIRATORY_TRACT | Status: DC

## 2015-04-27 MED ORDER — FONDAPARINUX SODIUM 2.5 MG/0.5ML ~~LOC~~ SOLN
2.5000 mg | Freq: Every day | SUBCUTANEOUS | Status: DC
  Administered 2015-04-28 – 2015-05-01 (×4): 2.5 mg via SUBCUTANEOUS
  Filled 2015-04-27 (×5): qty 0.5

## 2015-04-27 MED ORDER — METHADONE HCL 10 MG PO TABS
50.0000 mg | ORAL_TABLET | Freq: Every day | ORAL | Status: DC
  Administered 2015-04-28 – 2015-04-29 (×2): 50 mg via ORAL
  Administered 2015-04-30: 45 mg via ORAL
  Filled 2015-04-27 (×3): qty 5

## 2015-04-27 MED ORDER — ACETAMINOPHEN 325 MG PO TABS
975.0000 mg | ORAL_TABLET | Freq: Once | ORAL | Status: AC
  Administered 2015-04-27: 975 mg via ORAL
  Filled 2015-04-27: qty 3

## 2015-04-27 MED ORDER — SODIUM CHLORIDE 0.9 % IV BOLUS (SEPSIS)
1000.0000 mL | Freq: Once | INTRAVENOUS | Status: AC
  Administered 2015-04-27: 1000 mL via INTRAVENOUS

## 2015-04-27 MED ORDER — SODIUM CHLORIDE 0.9 % IV SOLN
INTRAVENOUS | Status: DC
  Administered 2015-04-27 – 2015-04-30 (×3): via INTRAVENOUS

## 2015-04-27 MED ORDER — ALBUTEROL SULFATE (2.5 MG/3ML) 0.083% IN NEBU: 2.5000 mg | INHALATION_SOLUTION | RESPIRATORY_TRACT | Status: DC | PRN

## 2015-04-27 MED ORDER — METHADONE HCL 10 MG PO TABS: 50.0000 mg | ORAL_TABLET | Freq: Every day | ORAL | Status: DC

## 2015-04-27 MED ORDER — ALBUTEROL (5 MG/ML) CONTINUOUS INHALATION SOLN
10.0000 mg/h | INHALATION_SOLUTION | RESPIRATORY_TRACT | Status: AC
  Administered 2015-04-27: 10 mg/h via RESPIRATORY_TRACT
  Filled 2015-04-27: qty 20

## 2015-04-27 MED ORDER — BUDESONIDE 0.5 MG/2ML IN SUSP
1.0000 mg | Freq: Two times a day (BID) | RESPIRATORY_TRACT | Status: DC
  Administered 2015-04-28 (×2): 1 mg via RESPIRATORY_TRACT
  Administered 2015-04-29: 0.5 mg via RESPIRATORY_TRACT
  Administered 2015-04-29 – 2015-05-01 (×3): 1 mg via RESPIRATORY_TRACT
  Filled 2015-04-27 (×8): qty 4

## 2015-04-27 MED ORDER — CYCLOBENZAPRINE HCL 5 MG PO TABS
5.0000 mg | ORAL_TABLET | Freq: Two times a day (BID) | ORAL | Status: DC
  Administered 2015-04-28: 5 mg via ORAL
  Filled 2015-04-27: qty 1

## 2015-04-27 MED ORDER — ONDANSETRON HCL 4 MG PO TABS: 4.0000 mg | ORAL_TABLET | Freq: Four times a day (QID) | ORAL | Status: DC | PRN

## 2015-04-27 MED ORDER — ACETAMINOPHEN 500 MG PO TABS: 1000.0000 mg | ORAL_TABLET | Freq: Four times a day (QID) | ORAL | Status: DC | PRN

## 2015-04-27 MED ORDER — NICOTINE 14 MG/24HR TD PT24
14.0000 mg | MEDICATED_PATCH | Freq: Every day | TRANSDERMAL | Status: DC
Start: 2015-04-27 — End: 2015-05-01
  Administered 2015-04-28 – 2015-04-30 (×3): 14 mg via TRANSDERMAL
  Filled 2015-04-27 (×6): qty 1

## 2015-04-27 MED ORDER — SODIUM CHLORIDE 0.9 % IJ SOLN
3.0000 mL | Freq: Two times a day (BID) | INTRAMUSCULAR | Status: DC
  Administered 2015-04-30 – 2015-05-01 (×2): 3 mL via INTRAVENOUS

## 2015-04-27 MED ORDER — IOHEXOL 300 MG/ML  SOLN
75.0000 mL | Freq: Once | INTRAMUSCULAR | Status: AC | PRN
  Administered 2015-04-27: 75 mL via INTRAVENOUS

## 2015-04-27 MED ORDER — DEXAMETHASONE SODIUM PHOSPHATE 10 MG/ML IJ SOLN
10.0000 mg | Freq: Once | INTRAMUSCULAR | Status: AC
  Administered 2015-04-27: 10 mg via INTRAVENOUS
  Filled 2015-04-27: qty 1

## 2015-04-27 MED ORDER — DEXTROSE 5 % IV SOLN
500.0000 mg | INTRAVENOUS | Status: DC
  Administered 2015-04-27 – 2015-04-29 (×3): 500 mg via INTRAVENOUS
  Filled 2015-04-27 (×4): qty 500

## 2015-04-27 MED ORDER — DEXTROSE 5 % IV SOLN
1.0000 g | INTRAVENOUS | Status: DC
  Administered 2015-04-27 – 2015-04-29 (×3): 1 g via INTRAVENOUS
  Filled 2015-04-27 (×4): qty 10

## 2015-04-27 MED ORDER — ACETAMINOPHEN 650 MG RE SUPP
650.0000 mg | Freq: Four times a day (QID) | RECTAL | Status: DC | PRN
Start: 2015-04-27 — End: 2015-05-01

## 2015-04-27 MED ORDER — ONDANSETRON HCL 4 MG/2ML IJ SOLN: 4.0000 mg | Freq: Four times a day (QID) | INTRAMUSCULAR | Status: DC | PRN

## 2015-04-27 MED ORDER — METHADONE HCL 10 MG/ML PO CONC: 50.0000 mg | Freq: Every day | ORAL | Status: DC

## 2015-04-27 NOTE — ED Provider Notes (Signed)
CSN: [id number]     Arrival date & time 04/27/15  1307 History   First MD Initiated Contact with Patient 04/27/15 1325     Chief Complaint  Patient presents with  . Sore Throat     (Consider location/radiation/quality/duration/timing/severity/associated sxs/prior Treatment) HPI Patient presents to the emergency department with positive TB skin test.  Patient, states he has been having difficulty with cough, fever, sweats, sore throat and throat swelling over the last few days.  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  Patient states that nothing seems make his condition, better or worse.  Patient denies chest pain, diarrhea, headache, blurred vision, back pain, neck pain, abdominal pain, hematemesis, bloody stool, dysuria, or syncope. Past Medical History  Diagnosis Date  . Asthma   . Bipolar 1 disorder   . Shortness of breath   . GERD (gastroesophageal reflux disease)     OTC meds   Past Surgical History  Procedure Laterality Date  . Appendectomy    . Mandible surgery    . Orif finger fracture  12/02/2011    Procedure: OPEN REDUCTION INTERNAL FIXATION (ORIF) METACARPAL (FINGER) FRACTURE;  Surgeon: Marlowe Shores, MD;  Location: [facility] OR;  Service: Orthopedics;  Laterality: Right;  right ring metatarsal   Family History  Problem Relation Age of Onset  . Anesthesia problems Neg Hx   . Hypotension Neg Hx   . Malignant hyperthermia Neg Hx   . Pseudochol deficiency Neg Hx    Social History  Substance Use Topics  . Smoking status: Current Every Day Smoker -- 0.50 packs/day for 10 years    Types: Cigarettes  . Smokeless tobacco: Never Used  . Alcohol Use: No    Review of Systems  All other systems negative except as documented in the HPI. All pertinent positives and negatives as reviewed in the HPI.  Allergies  Ibuprofen  Home Medications   Prior to Admission medications   Medication Sig Start  Date End Date Taking? Authorizing Graison Leinberger  albuterol (PROAIR HFA) 108 (90 BASE) MCG/ACT inhaler Inhale 2 puffs into the lungs every 3 (three) hours as needed for wheezing or shortness of breath. 08/27/14   Nestor Ramp, MD  albuterol (PROVENTIL) (2.5 MG/3ML) 0.083% nebulizer solution Take 2.5 mg by nebulization every 4 (four) hours as needed for wheezing or shortness of breath.    Historical Bellarose Burtt, MD  cyclobenzaprine (FLEXERIL) 5 MG tablet Take 1 tablet (5 mg total) by mouth 2 (two) times daily. 09/10/14   Nestor Ramp, MD  diphenhydramine-acetaminophen (TYLENOL PM) 25-500 MG TABS Take 2 tablets by mouth at bedtime as needed (pain).    Historical Dennie Vecchio, MD  fluticasone (FLOVENT HFA) 220 MCG/ACT inhaler Inhale 2 puffs into the lungs 2 (two) times daily. 08/27/14   Nestor Ramp, MD  lithium carbonate 300 MG capsule Take 300-600 mg by mouth 2 (two) times daily with a meal. Take 1 capsule in the morning and 2 capsules at bedtime    Historical Ohanna Gassert, MD  Multiple Vitamin (MULTIVITAMIN WITH MINERALS) TABS tablet Take 1 tablet by mouth daily.    Historical Dashay Giesler, MD   BP 174/109 mmHg  Pulse 79  Temp(Src) 99 F (37.2 C) (Oral)  Resp 9  Ht 6' (1.829 m)  Wt 220 lb (99.791 kg)  BMI 29.83 kg/m2  SpO2 100% Physical Exam  Constitutional: He is oriented to person, place, and time. He appears well-developed and well-nourished.  No distress.  HENT:  Head: Normocephalic and atraumatic.  Mouth/Throat: Oropharynx is clear and moist.  Eyes: Pupils are equal, round, and reactive to light.  Neck: Normal range of motion. Neck supple.  Cardiovascular: Normal rate, regular rhythm and normal heart sounds.  Exam reveals no gallop and no friction rub.   No murmur heard. Pulmonary/Chest: Effort normal. No respiratory distress. He has wheezes. He has no rales.  Neurological: He is alert and oriented to person, place, and time. He exhibits normal muscle tone. Coordination normal.  Skin: Skin is warm and dry. No  rash noted. No erythema.  Nursing note and vitals reviewed.   ED Course  Procedures (including critical care time) Labs Review Labs Reviewed  BASIC METABOLIC PANEL - Abnormal; Notable for the following:    Chloride 98 (*)    Glucose, Bld 116 (*)    All other components within normal limits  CBC WITH DIFFERENTIAL/PLATELET - Abnormal; Notable for the following:    WBC 13.6 (*)    Neutro Abs 11.2 (*)    All other components within normal limits  RAPID STREP SCREEN (NOT AT [facility])  CULTURE, GROUP A STREP    Imaging Review Dg Chest 2 View  04/27/2015   CLINICAL DATA:  Shortness of breath 4 days.  EXAM: CHEST  2 VIEW  COMPARISON:  02/16/2014  FINDINGS: The heart size and mediastinal contours are within normal limits. Both lungs are clear. The visualized skeletal structures are unremarkable.  IMPRESSION: No active cardiopulmonary disease.   Electronically Signed   By: Elberta Fortis M.D.   On: 04/27/2015 15:03   I have personally reviewed and evaluated these images and lab results as part of my medical decision-making.   EKG Interpretation   Date/Time:  Monday April 27 2015 13:28:32 EDT Ventricular Rate:  66 PR Interval:  135 QRS Duration: 93 QT Interval:  403 QTC Calculation: 422 R Axis:   59 Text Interpretation:  Sinus rhythm No significant change since last  tracing Confirmed by Gwendolyn Grant  MD, BLAIR ([id number]) on 04/27/2015 1:32:17 PM      The patient will need to be admitted to the hospital for further evaluation and care of a possible TB  Charlestine Night, PA-C 04/27/15 1700  Elwin Mocha, MD 04/28/15 1253

## 2015-04-27 NOTE — Progress Notes (Signed)
CAT completed. RT will continue to monitor.

## 2015-04-27 NOTE — ED Notes (Signed)
Pt here with sob, sore throat, feels his throat is swollen. 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 Has n/v for 2 days. Mask in place.

## 2015-04-27 NOTE — Consult Note (Addendum)
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 The rapid onset of his illness however would argue against this. His pharyngeal erythema and erosions, suggest that he has a viral pharyngitis. However TB pharyngitis is a known ocurrence and is highly contagious. Alternatively his chills and sweats could be to drug withdrawal.  Thank you so much for this interesting consult,   Johny Sax (pager) [phone] [email or web address]  Peter Bailey is an 29 y.o. male.  HPI: 29 yo M with a history of prescription drug abuse  and bipolar disease. He was being evaluated at a detoxification center, he had a PPD placed on 9-23. He returned and the reading today and was it noted to be positive. He was sent to the emergency room. In the emergency room he complained of sweats, fevers and chills for the last 24 hours. He has also had a sore throat for the past 3 days. He had a negative chest x-ray. Due to his sore throat he had a CT scan of the neck which showed: 1. Multifocal airspace opacities in the lung apices. Appearance concerning for multi focal pneumonia or atypical infectious process. 2. Symmetric prominence of the tonsillar pillars, lingual tonsils, soft no focal tonsillar mass observed. Palate, with mild submandibular and internal jugular adenopathy detailed above, probably reactive/inflammatory. Mild prominence of residual adenoidal tissue. 3. Chronic ethmoid sinusitis.  Tuberculosis Risk factors: Negative PPD 5 years ago. Incarcerated 5  years ago. No history of TB exposure Born in the [place]   Past Medical History  Diagnosis Date  . Asthma   . Bipolar 1 disorder   . Shortness of breath   . GERD (gastroesophageal reflux disease)     OTC meds    Past Surgical History  Procedure Laterality Date  . Appendectomy    . Mandible surgery    . Orif finger fracture  12/02/2011    Procedure: OPEN REDUCTION INTERNAL FIXATION (ORIF) METACARPAL (FINGER) FRACTURE;  Surgeon: Marlowe Shores, MD;  Location: [facility] OR;  Service: Orthopedics;  Laterality: Right;  right ring metatarsal     Allergies  Allergen Reactions  . Ibuprofen Other (See Comments)    Eyes and throat swell    Medications: Scheduled:  Abtx:  Anti-infectives    None      Total days of antibiotics: 0          Social History:  reports that he has been smoking Cigarettes.  He has a 5 pack-year smoking history. He has never used smokeless tobacco. He reports that he does not drink alcohol or use illicit drugs.  Family History  Problem Relation Age of Onset  . Anesthesia problems Neg Hx   . Hypotension Neg Hx   . Malignant hyperthermia Neg Hx   . Pseudochol deficiency Neg Hx     Occasional cough. No sputum. No hemoptysis. No change in weight. Normal urine. Normal bowel movements. No lymphadenopathy. Blurry vision today. Please see HPI. 12 point ROS o/w (-)  Blood pressure 167/96, pulse 109, temperature 99 F (37.2 C), temperature source Oral, resp. rate 17, height 6' (1.829 m), weight  99.791 kg (220 lb), SpO2 95 %. General appearance: alert, cooperative, mild distress and diaphoretic Eyes: negative findings: conjunctivae and sclerae normal and pupils equal, round, reactive to light and accomodation Throat: normal findings: oropharynx pink & moist without lesions or evidence of thrush and pharyngeal erythema, erosions Neck: no adenopathy and supple, symmetrical, trachea midline Lungs: clear to auscultation bilaterally Heart: regular rate and  rhythm Abdomen: normal findings: bowel sounds normal and soft, non-tender Extremities: edema none Lymph nodes: Cervical, supraclavicular, and axillary nodes normal.   Results for orders placed or performed during the hospital encounter of 04/27/15 (from the past 48 hour(s))  Basic metabolic panel     Status: Abnormal   Collection Time: 04/27/15  1:45 PM  Result Value Ref Range   Sodium 137 135 - 145 mmol/L   Potassium 4.5 3.5 - 5.1 mmol/L   Chloride 98 (L) 101 - 111 mmol/L   CO2 29 22 - 32 mmol/L   Glucose, Bld 116 (H) 65 - 99 mg/dL   BUN 10 6 - 20 mg/dL   Creatinine, Ser 4.02 0.61 - 1.24 mg/dL   Calcium 9.6 8.9 - 27.5 mg/dL   GFR calc non Af Amer >60 >60 mL/min   GFR calc Af Amer >60 >60 mL/min    Comment: (NOTE) The eGFR has been calculated using the CKD EPI equation. This calculation has not been validated in all clinical situations. eGFR's persistently <60 mL/min signify possible Chronic Kidney Disease.    Anion gap 10 5 - 15  CBC with Differential     Status: Abnormal   Collection Time: 04/27/15  1:45 PM  Result Value Ref Range   WBC 13.6 (H) 4.0 - 10.5 K/uL   RBC 4.39 4.22 - 5.81 MIL/uL   Hemoglobin 14.1 13.0 - 17.0 g/dL   HCT 06.6 84.5 - 87.2 %   MCV 95.2 78.0 - 100.0 fL   MCH 32.1 26.0 - 34.0 pg   MCHC 33.7 30.0 - 36.0 g/dL   RDW 15.1 57.9 - 14.7 %   Platelets 333 150 - 400 K/uL   Neutrophils Relative % 82 %   Neutro Abs 11.2 (H) 1.7 - 7.7 K/uL   Lymphocytes Relative 11 %   Lymphs Abs 1.5 0.7 - 4.0 K/uL   Monocytes Relative 6 %   Monocytes Absolute 0.8 0.1 - 1.0 K/uL   Eosinophils Relative 1 %   Eosinophils Absolute 0.1 0.0 - 0.7 K/uL   Basophils Relative 0 %   Basophils Absolute 0.0 0.0 - 0.1 K/uL  Rapid strep screen     Status: None   Collection Time: 04/27/15  2:06 PM  Result Value Ref Range   Streptococcus, Group A Screen (Direct) NEGATIVE NEGATIVE    Comment: (NOTE) A Rapid Antigen test may result negative if the antigen level in the sample is below  the detection level of this test. The FDA has not cleared this test as a stand-alone test therefore the rapid antigen negative result has reflexed to a Group A Strep culture.    No results found for: SDES, SPECREQUEST, CULT, REPTSTATUS Dg Chest 2 View  04/27/2015   CLINICAL DATA:  Shortness of breath 4 days.  EXAM: CHEST  2 VIEW  COMPARISON:  02/16/2014  FINDINGS: The heart size and mediastinal contours are within normal limits. Both lungs are clear. The visualized skeletal structures are unremarkable.  IMPRESSION: No active cardiopulmonary disease.   Electronically Signed   By: Elberta Fortis M.D.   On: 04/27/2015 15:03  Ct Soft Tissue Neck W Contrast  04/27/2015   CLINICAL DATA:  Sore throat. Pain. Shortness of breath. Lethargy. Diaphoresis. Positive PPD.  EXAM: CT NECK WITH CONTRAST  TECHNIQUE: Multidetector CT imaging of the neck was performed using the standard protocol following the bolus administration of intravenous contrast.  CONTRAST:  53mL OMNIPAQUE IOHEXOL 300 MG/ML  SOLN  COMPARISON:  07/14/2006  FINDINGS: Pharynx and larynx: Mild prominence of the tonsillar pillars. Mildly prominent soft palate. Moderately prominent lingual tonsils appear grossly symmetric. Small amount of gas centrally tracks along the residual adenoidal tissue.  Epiglottis and ariepiglottic folds normal.  Glottic level normal.  Salivary glands: Unremarkable  Thyroid: Unremarkable  Lymph nodes: Left submandibular node 1.2 cm in short axis, image 65 series 201. Separate adjacent left submandibular lymph node 1.0 cm in short axis, image 63 series 201. Smaller right submandibular lymph nodes are present. Left station 2 lymph node 1.0 cm in short axis, image 56 series 201. Right station 2 lymph node 1.2 cm in short axis, image 43 series 201.  Vascular: Unremarkable  Limited intracranial: Unremarkable  Visualized orbits: Unremarkable  Mastoids and visualized paranasal sinuses: Mild chronic ethmoid sinusitis.  Skeleton: High  density in the right C7-T1 neural foramen is thought to represent venous contrast. Otherwise negative.  Upper chest: Patchy airspace opacities in the lung apices, surprisingly difficult to appreciate on chest radiography but multi focal and readily apparent on CT.  IMPRESSION: 1. Multifocal airspace opacities in the lung apices. Appearance concerning for multi focal pneumonia or atypical infectious process. 2. Symmetric prominence of the tonsillar pillars, lingual tonsils, soft no focal tonsillar mass observed. Palate, with mild submandibular and internal jugular adenopathy detailed above, probably reactive/inflammatory. Mild prominence of residual adenoidal tissue. 3. Chronic ethmoid sinusitis.   Electronically Signed   By: Van Clines M.D.   On: 04/27/2015 15:40   Recent Results (from the past 240 hour(s))  Rapid strep screen     Status: None   Collection Time: 04/27/15  2:06 PM  Result Value Ref Range Status   Streptococcus, Group A Screen (Direct) NEGATIVE NEGATIVE Final    Comment: (NOTE) A Rapid Antigen test may result negative if the antigen level in the sample is below the detection level of this test. The FDA has not cleared this test as a stand-alone test therefore the rapid antigen negative result has reflexed to a Group A Strep culture.       04/27/2015, 5:16 PM     LOS: 0 days    Records and images were personally reviewed where available.

## 2015-04-27 NOTE — ED Notes (Signed)
Patient transported to X-ray 

## 2015-04-27 NOTE — ED Notes (Signed)
Report attempted 

## 2015-04-27 NOTE — ED Notes (Signed)
Respiratory at bedside.

## 2015-04-27 NOTE — ED Notes (Signed)
Paged Hensel/Family Practice to [facility] @ [phone]

## 2015-04-27 NOTE — H&P (Signed)
Family Medicine Teaching [facility] Admission History and Physical Service Pager: [phone]  Patient name: Peter Bailey Medical record number: [id number] Date of birth: 22-Nov-1985 Age: 29 y.o. Gender: male  Primary Care Provider: Denny Levy, MD Consultants: ID Code Status: FULL  Chief Complaint: SOB, sore throat, fever, sweats  Assessment and Plan: Peter Bailey is a 29 y.o. male presenting with SOB, sore throat, fever, sweats and positive PPD as outpatient. PMH is significant for intermittent asthma, bipolar disorder, opiate abuse, and smoking (0.5-1ppd).   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 Strep screen negative. He has a leukocytosis up to 13.6 with a left shift. CT Head and neck showed multiple bilateral enlarged lymph nodes in the neck, multifocal airspace opacities in the lung apices (which may represent atypical pneumonia vs reactivated TB). Unfortunately many of his symptoms could be secondary to opiate withdrawal (please see below). - ID consulted, appreciate recommendations -f/u HIV test -f/u Hep C antibody - f/u RVP panel  -Isolate for TB -Collect sputum for AFB culture and gram stain -consider bronchoscopy -consider lymph node biopsy -supportive care for pharyngitis: chloraseptic throat spray, tylenol  Q6H - obtain blood cultures - start ceftriaxone and azithromycin for community acquired pneumonia  Opiate Abuse and withdrawal: Pt just recently began methadone treatment with [facility] at [phone]. States he is on  of methadone daily. Many of his symptoms are likely secondary to withdrawal - diaphoresis, diffuse pain, nausea, rhinorrea.  -methadone while inpatient  - spoke [place] at [facility]- patient last got methadone  liquid this morning around 10am. He has been going there since  9/23. It appears he has been increase by  daily over the last few days. - Will give methadone  tomorrow, consider touching base with clinic every day or so concerning management and possible increase -supportive care for withdrawal: flexeril (myalgias), hydroxyzine (rhniorrea/restlessness/anxiety), zofran (nausea/vomiting  - avoiding benzos in the setting of methadone use and h/o addiction - avoiding trazodone given concerns for prolonged QTc with methadone and serotonin syndrome (unsure how frequent/likely these interactions truly are). - could consider clonidine if BPs permit for anxiety/restlessness.   Tobacco Abuse: 0.5-1ppd - nicotine patch,   Bipolar Depression: Pt was prescribed lithium in the past, but has not been taking this for at least 6 months. - continue to monitor  FEN/GI: Regular diet/ NS @ 100cc/hr Prophylaxis: fondapainux 2.5mg  QD 2/2 religious avoidance of pork  Disposition: Admit to Family Medicine, Hensel attending  History of Present Illness:  Peter Bailey is a 29 y.o. male presenting with a positive PPD test from his detox center, shortness of breath, sore throat, and sweats.  Within the last week the patient's noticed rhinorrhea, postnasal drip, sore throat, and worsening SOB. Approximately 3-4 days ago, it became difficult to breathe and his face hurt. He denies fevers.He started having night sweats since yesterday. He's tried chloraseptic spray and Flonase without improvement.   He's been going through opioid detox and is currently on methadone, He goes to [facility]. States that he is on  of methadone. He took pain medication yesterday; he states he didn't know he wasn't supposed to take his oxycodone  while on methadone. He noted he started having headaches and abdominal pain 2 days ago when he decreased his oxycodone use (notes he used to crush and snort his pain medication but has not  done this since Thursday). No  diarrhea. Endorses myalgias since decreasing oxycodone.    In the ED, the patient was afebrile and slightly tachycardic to the 100s. A CXR was clear. A CT of the neck was obtained due to signfificant pain and revealed multifocal airspaces opacities in the apices concerning for pneumonia. ID was consulted for the +PPD. He received Decadron , continuous albuterol treatment, and 1L NS bolus.    Review Of Systems: Per HPI with the following additions: none Otherwise 12 point review of systems was performed and was unremarkable.  Patient Active Problem List   Diagnosis Date Noted  . Atypical pneumonia 04/27/2015  . Asthma exacerbation 02/16/2014  . URI (upper respiratory infection) 05/30/2011  . Left ankle sprain 03/22/2011  . Low back pain 03/22/2011  . Asthma 04/19/2010  . TOBACCO USER 05/09/2009  . Bipolar disorder 04/05/2007   Past Medical History: Past Medical History  Diagnosis Date  . Asthma   . Bipolar 1 disorder   . Shortness of breath   . GERD (gastroesophageal reflux disease)     OTC meds   Past Surgical History: Past Surgical History  Procedure Laterality Date  . Appendectomy    . Mandible surgery    . Orif finger fracture  12/02/2011    Procedure: OPEN REDUCTION INTERNAL FIXATION (ORIF) METACARPAL (FINGER) FRACTURE;  Surgeon: Marlowe Shores, MD;  Location: [facility] OR;  Service: Orthopedics;  Laterality: Right;  right ring metatarsal   Social History: Social History  Substance Use Topics  . Smoking status: Current Every Day Smoker -- 0.50 packs/day for 10 years    Types: Cigarettes  . Smokeless tobacco: Never Used  . Alcohol Use: No   Additional social history: lives with wife  Please also refer to relevant sections of EMR.  Family History: Family History  Problem Relation Age of Onset  . Anesthesia problems Neg Hx   . Hypotension Neg Hx   . Malignant hyperthermia Neg Hx   . Pseudochol deficiency Neg Hx    Allergies and Medications: Allergies  Allergen  Reactions  . Ibuprofen Other (See Comments)    Eyes and throat swell   No current facility-administered medications on file prior to encounter.   Current Outpatient Prescriptions on File Prior to Encounter  Medication Sig Dispense Refill  . albuterol (PROAIR HFA) 108 (90 BASE) MCG/ACT inhaler Inhale 2 puffs into the lungs every 3 (three) hours as needed for wheezing or shortness of breath. 1 Inhaler 12  . albuterol (PROVENTIL) (2.5 MG/3ML) 0.083% nebulizer solution Take 2.5 mg by nebulization every 4 (four) hours as needed for wheezing or shortness of breath.    . cyclobenzaprine (FLEXERIL) 5 MG tablet Take 1 tablet (5 mg total) by mouth 2 (two) times daily. 20 tablet 0  . diphenhydramine-acetaminophen (TYLENOL PM) 25-500 MG TABS Take 2 tablets by mouth at bedtime as needed (pain).    . fluticasone (FLOVENT HFA) 220 MCG/ACT inhaler Inhale 2 puffs into the lungs 2 (two) times daily. 1 Inhaler 12  . lithium carbonate 300 MG capsule Take 300-600 mg by mouth 2 (two) times daily with a meal. Take 1 capsule in the morning and 2 capsules at bedtime    . Multiple Vitamin (MULTIVITAMIN WITH MINERALS) TABS tablet Take 1 tablet by mouth daily.    . [DISCONTINUED] diazepam (VALIUM) 5 MG tablet Take 1 tablet (5 mg total) by mouth every 8 (eight) hours as needed for anxiety or sleep. 30 tablet 0    Objective: BP 157/82 mmHg  Pulse 101  Temp(Src) 99 F (37.2 C) (Oral)  Resp 11  Ht 6' (1.829 m)  Wt 220 lb (99.791 kg)  BMI 29.83 kg/m2  SpO2 97% Exam: General: pt lying in stretcher shaking, moaning, and sweating Eyes: PEERLA conjunctivae non-injected ENTM: oropharynx erythematous, tonsils swollen Neck: lymphadenopathy in cervical chains bilaterally Cardiovascular: RRR no M/r/g Respiratory: CTAB without wheezing, rhonchi, or crackles. Abdomen: mild diffuse abdominal pain (that resolves on repeat exam) without rebound or guarding, + BS Skin: + PPD test in L forearm, red and indurated +piloerection   Neuro: cranial nerves grossly intact, 5/5 strength  Psych: pt affect appropriate to condition, tearful 2/2 pain  Labs and Imaging: CBC BMET   Recent Labs Lab 04/27/15 1345  WBC 13.6*  HGB 14.1  HCT 41.8  PLT 333    Recent Labs Lab 04/27/15 1345  NA 137  K 4.5  CL 98*  CO2 29  BUN 10  CREATININE 0.99  GLUCOSE 116*  CALCIUM 9.6     EKG: NSR HR 66, T wave inversion in V1 and aVR (stable). QTc 422 HIV nonreactive  Rapig GAS negative   Dg Chest 2 View  04/27/2015   CLINICAL DATA:  Shortness of breath 4 days.  EXAM: CHEST  2 VIEW  COMPARISON:  02/16/2014  FINDINGS: The heart size and mediastinal contours are within normal limits. Both lungs are clear. The visualized skeletal structures are unremarkable.  IMPRESSION: No active cardiopulmonary disease.   Electronically Signed   By: Elberta Fortis M.D.   On: 04/27/2015 15:03   Ct Soft Tissue Neck W Contrast  04/27/2015   CLINICAL DATA:  Sore throat. Pain. Shortness of breath. Lethargy. Diaphoresis. Positive PPD.  EXAM: CT NECK WITH CONTRAST  TECHNIQUE: Multidetector CT imaging of the neck was performed using the standard protocol following the bolus administration of intravenous contrast.  CONTRAST:  75mL OMNIPAQUE IOHEXOL 300 MG/ML  SOLN  COMPARISON:  07/14/2006  FINDINGS: Pharynx and larynx: Mild prominence of the tonsillar pillars. Mildly prominent soft palate. Moderately prominent lingual tonsils appear grossly symmetric. Small amount of gas centrally tracks along the residual adenoidal tissue.  Epiglottis and ariepiglottic folds normal.  Glottic level normal.  Salivary glands: Unremarkable  Thyroid: Unremarkable  Lymph nodes: Left submandibular node 1.2 cm in short axis, image 65 series 201. Separate adjacent left submandibular lymph node 1.0 cm in short axis, image 63 series 201. Smaller right submandibular lymph nodes are present. Left station 2 lymph node 1.0 cm in short axis, image 56 series 201. Right station 2 lymph node 1.2 cm  in short axis, image 43 series 201.  Vascular: Unremarkable  Limited intracranial: Unremarkable  Visualized orbits: Unremarkable  Mastoids and visualized paranasal sinuses: Mild chronic ethmoid sinusitis.  Skeleton: High density in the right C7-T1 neural foramen is thought to represent venous contrast. Otherwise negative.  Upper chest: Patchy airspace opacities in the lung apices, surprisingly difficult to appreciate on chest radiography but multi focal and readily apparent on CT.  IMPRESSION: 1. Multifocal airspace opacities in the lung apices. Appearance concerning for multi focal pneumonia or atypical infectious process. 2. Symmetric prominence of the tonsillar pillars, lingual tonsils, soft no focal tonsillar mass observed. Palate, with mild submandibular and internal jugular adenopathy detailed above, probably reactive/inflammatory. Mild prominence of residual adenoidal tissue. 3. Chronic ethmoid sinusitis.   Electronically Signed   By: Gaylyn Rong M.D.   On: 04/27/2015 15:40     Garth Bigness, Med Student 04/27/2015, 5:37 PM Medical Student pager [phone] [facility] FPTS Intern pager: [phone], text pages welcome  RESIDENT ADDENDUM  I have separately seen and examined the patient. I have discussed the findings and exam with the medical student and agree with the above note, which I have edited appropriately. I helped develop the management plan that is described in the student's note, and I agree with the content.  Additionally I have outlined my exam and assessment/plan below:   PE:  Blood pressure 166/94, pulse 97, temperature 99 F (37.2 C), temperature source Oral, resp. rate 20, height 6' (1.829 m), weight 220 lb (99.791 kg), SpO2 94 %. Gen: sitting up in moderate distress, initially calm however later shivering  Eyes: Conjunctivae non-injected. PERRL.  ENTM: Moist mucous membranes. Erythema and erosions of the tonsils and uvula. No exudates noted. Clear nasal  discharge  Neck: Supple, shotty cervical LAD with pain to palpation over the L side.  Cardiovascular: RRR. No murmurs, rubs, or gallops noted. No pitting edema noted. Respiratory: No increased WOB. CTAB without wheezing, rhonchi, or crackles noted. Abdomen: +BS, soft, non-distended, non-tender.  MSK: Normal bulk and tone. No gross deformities noted.  Skin: +Piloerection. No rashes noted. Indurated/erythematous region on the L forearm with markings Neuro: A&O x4. No gross neurologic deficits  Psych:  Appropriate mood and affect.   A/P:  # ID: Patient noted to have erythema and erosions of the oropharynx with negative rapid strep: could be secondary to viral infection vs TB. Negative CXR however CT of neck/lung apices concerning for multifocal PNA (could be reactivated TB as he has a +PPD test). HIV negative.  Will place in a negative pressure room. Sputum for TB, RVP, hepatitis C. Consider treating for community acquired pneumonia with ceftriaxone and azithromycin. Tylenol PRN pain and fevers, chloraseptic spray   # Opiate abuse and withdrawal: Patient recently began treatment. Many of his symptoms of withdrawal are definitely confounding the picture. Discussed avoiding narcotics for pain, pt agreeable. Tylenol PRN pain, flexeril PRN myalgias, hydroxyzine PRN rhinorrhea/anxiety/restlessness, zofran PRN nausea. Contacting treatment center for methadone dose and notes.    Joanna Puff, MD PGY-2,  [facility] 04/27/2015  7:28 PM

## 2015-04-28 ENCOUNTER — Inpatient Hospital Stay (HOSPITAL_COMMUNITY): Payer: Self-pay

## 2015-04-28 DIAGNOSIS — R918 Other nonspecific abnormal finding of lung field: Secondary | ICD-10-CM

## 2015-04-28 LAB — RESPIRATORY VIRUS PANEL
ADENOVIRUS: NEGATIVE
INFLUENZA B 1: NEGATIVE
Influenza A: NEGATIVE
METAPNEUMOVIRUS: NEGATIVE
Parainfluenza 1: NEGATIVE
Parainfluenza 2: NEGATIVE
Parainfluenza 3: NEGATIVE
RESPIRATORY SYNCYTIAL VIRUS A: NEGATIVE
RHINOVIRUS: NEGATIVE
Respiratory Syncytial Virus B: NEGATIVE

## 2015-04-28 LAB — BASIC METABOLIC PANEL
Anion gap: 11 (ref 5–15)
BUN: 7 mg/dL (ref 6–20)
CO2: 27 mmol/L (ref 22–32)
Calcium: 9.5 mg/dL (ref 8.9–10.3)
Chloride: 100 mmol/L — ABNORMAL LOW (ref 101–111)
Creatinine, Ser: 0.85 mg/dL (ref 0.61–1.24)
GFR calc Af Amer: >60 mL/min (ref >60)
GFR calc non Af Amer: >60 mL/min (ref >60)
Glucose, Bld: 131 mg/dL — ABNORMAL HIGH (ref 65–99)
Potassium: 4.6 mmol/L (ref 3.5–5.1)
Sodium: 138 mmol/L (ref 135–145)

## 2015-04-28 LAB — CBC
HEMATOCRIT: 39.6 % (ref 39.0–52.0)
Hemoglobin: 13.2 g/dL (ref 13.0–17.0)
MCH: 31.6 pg (ref 26.0–34.0)
MCHC: 33.3 g/dL (ref 30.0–36.0)
MCV: 94.7 fL (ref 78.0–100.0)
PLATELETS: 317 10*3/uL (ref 150–400)
RBC: 4.18 MIL/uL — ABNORMAL LOW (ref 4.22–5.81)
RDW: 13.8 % (ref 11.5–15.5)
WBC: 9.6 10*3/uL (ref 4.0–10.5)

## 2015-04-28 LAB — HEPATITIS C ANTIBODY: HCV Ab: <0.1 s/co ratio (ref 0.0–0.9)

## 2015-04-28 MED ORDER — MENTHOL 3 MG MT LOZG
1.0000 | LOZENGE | OROMUCOSAL | Status: DC | PRN
  Filled 2015-04-28 (×2): qty 9

## 2015-04-28 MED ORDER — CYCLOBENZAPRINE HCL 5 MG PO TABS
5.0000 mg | ORAL_TABLET | Freq: Three times a day (TID) | ORAL | Status: DC | PRN
  Administered 2015-04-28: 5 mg via ORAL
  Filled 2015-04-28: qty 1

## 2015-04-28 MED ORDER — PHENOL 1.4 % MT LIQD
1.0000 | OROMUCOSAL | Status: DC | PRN
  Administered 2015-04-28: 1 via OROMUCOSAL
  Filled 2015-04-28: qty 177

## 2015-04-28 MED ORDER — HYDROXYZINE HCL 25 MG PO TABS
50.0000 mg | ORAL_TABLET | Freq: Four times a day (QID) | ORAL | Status: DC | PRN
  Administered 2015-04-28 – 2015-04-30 (×4): 50 mg via ORAL
  Filled 2015-04-28 (×4): qty 2

## 2015-04-28 MED ORDER — SENNOSIDES-DOCUSATE SODIUM 8.6-50 MG PO TABS
1.0000 | ORAL_TABLET | Freq: Two times a day (BID) | ORAL | Status: DC
  Administered 2015-04-28 – 2015-05-01 (×5): 1 via ORAL
  Filled 2015-04-28 (×5): qty 1

## 2015-04-28 NOTE — Progress Notes (Signed)
Family Medicine Teaching Service Daily Progress Note Intern Pager: [phone]  Patient name: Peter Bailey Medical record number: [id number] Date of birth: Feb 19, 1986 Age: 29 y.o. Gender: male  Primary Care Provider: Denny Levy, MD Consultants: ID  Code Status: Full   Pt Overview and Major Events to Date:  9/26: Admitted   Assessment and Plan: Peter Bailey is a 29 y.o. male presenting with SOB, sore throat, fever, sweats and positive PPD as outpatient. PMH is significant for intermittent asthma, bipolar disorder, opiate abuse, and smoking (0.5-1ppd).   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 Strep screen negative. He had a leukocytosis up to 13.6 with a left shift. CT Head and neck showed multiple bilateral enlarged lymph nodes in the neck, multifocal airspace opacities in the lung apices (which may represent atypical pneumonia vs reactivated TB). Unfortunately many of his symptoms could be secondary to opiate withdrawal (please see below). - ID consulted, appreciate recommendations -HIV non-reactive  -Hep C antibody negative  - f/u RVP panel  -Isolation precautions for TB  - AFB culture and gram stain in process  -consider bronchoscopy -consider lymph node biopsy -supportive care for pharyngitis: chloraseptic throat spray, tylenol  Q6H - obtain blood cultures - ceftriaxone and azithromycin for community acquired pneumonia (9/26 >>) -WBC (9/27): 9.6   Opiate Abuse and withdrawal: Pt just recently began methadone treatment with [facility] at [phone]. States he is on  of methadone daily. Many of his symptoms are likely secondary to withdrawal - diaphoresis, diffuse pain, nausea, rhinorrea.  -methadone while inpatient  - spoke [place] at [facility] Has been going there since 9/23. It appears his methadone has  been  increased by  daily over the last few days. - Will give methadone  daily, consider touching base with clinic every day or so concerning management and possible increase -supportive care for withdrawal: flexeril (myalgias), hydroxyzine (rhniorrea/restlessness/anxiety), zofran (nausea/vomiting   Tobacco Abuse: 0.5-1ppd - nicotine patch,   Bipolar Depression: Pt was prescribed lithium in the past, but has not been taking this for at least 6 months. - continue to monitor  FEN/GI: Regular diet/ NS @ 100cc/hr Prophylaxis: fondapainux 2.5mg  QD 2/2 religious avoidance of pork  Disposition: Home/continued rehab pending clinical improvement   Subjective:  Patient very worried about his health right now. Admits to diaphoresis this morning. Denies diarrhea. Complains of extremely sore throat.   Objective: Temp:  [97.6 F (36.4 C)-99 F (37.2 C)] 97.6 F (36.4 C) (09/27 0511) Pulse Rate:  [71-109] 100 (09/27 0511) Resp:  [9-22] 19 (09/27 0511) BP: (141-174)/(76-109) 146/76 mmHg (09/27 0511) SpO2:  [94 %-100 %] 98 % (09/27 0511) Weight:  [220 lb (99.791 kg)] 220 lb (99.791 kg) (09/26 1323) Physical Exam: General: patient walking around room, very jittery  Throat: Erythematous and tonsils enlarged bilaterally.  Cardiovascular: RRR. No murmurs appreciated.  Respiratory: Occasional wheeze appreciated. Normal WOB.  Abdomen: soft, NTND Extremities: +PPD test in L forearm, red and infurated  Psych: Tearful and very anxious. Follows commands and answers questions appropriately.   Laboratory:  Recent Labs Lab 04/27/15 1345 04/28/15 0450  WBC 13.6* 9.6  HGB 14.1 13.2  HCT 41.8 39.6  PLT 333 317    Recent Labs Lab 04/27/15 1345 04/28/15 0450  NA 137 138  K 4.5 4.6  CL 98* 100*  CO2 29 27  BUN 10 7  CREATININE 0.99  0.85  CALCIUM 9.6 9.5  GLUCOSE 116* 131*   HIV nonreactive  Rapid GAS negative   Imaging/Diagnostic Tests: EKG: NSR HR 66, T wave inversion in V1  and aVR (stable). QTc 422  Dg Chest 2 View  04/27/2015 CLINICAL DATA: Shortness of breath 4 days. EXAM: CHEST 2 VIEW COMPARISON: 02/16/2014 FINDINGS: The heart size and mediastinal contours are within normal limits. Both lungs are clear. The visualized skeletal structures are unremarkable. IMPRESSION: No active cardiopulmonary disease. Electronically Signed By: Elberta Fortis M.D. On: 04/27/2015 15:03   Ct Soft Tissue Neck W Contrast  04/27/2015 CLINICAL DATA: Sore throat. Pain. Shortness of breath. Lethargy. Diaphoresis. Positive PPD. EXAM: CT NECK WITH CONTRAST TECHNIQUE: Multidetector CT imaging of the neck was performed using the standard protocol following the bolus administration of intravenous contrast. CONTRAST: 75mL OMNIPAQUE IOHEXOL 300 MG/ML SOLN COMPARISON: 07/14/2006 FINDINGS: Pharynx and larynx: Mild prominence of the tonsillar pillars. Mildly prominent soft palate. Moderately prominent lingual tonsils appear grossly symmetric. Small amount of gas centrally tracks along the residual adenoidal tissue. Epiglottis and ariepiglottic folds normal. Glottic level normal. Salivary glands: Unremarkable Thyroid: Unremarkable Lymph nodes: Left submandibular node 1.2 cm in short axis, image 65 series 201. Separate adjacent left submandibular lymph node 1.0 cm in short axis, image 63 series 201. Smaller right submandibular lymph nodes are present. Left station 2 lymph node 1.0 cm in short axis, image 56 series 201. Right station 2 lymph node 1.2 cm in short axis, image 43 series 201. Vascular: Unremarkable Limited intracranial: Unremarkable Visualized orbits: Unremarkable Mastoids and visualized paranasal sinuses: Mild chronic ethmoid sinusitis. Skeleton: High density in the right C7-T1 neural foramen is thought to represent venous contrast. Otherwise negative. Upper chest: Patchy airspace opacities in the lung apices, surprisingly difficult to appreciate on chest  radiography but multi focal and readily apparent on CT. IMPRESSION: 1. Multifocal airspace opacities in the lung apices. Appearance concerning for multi focal pneumonia or atypical infectious process. 2. Symmetric prominence of the tonsillar pillars, lingual tonsils, soft no focal tonsillar mass observed. Palate, with mild submandibular and internal jugular adenopathy detailed above, probably reactive/inflammatory. Mild prominence of residual adenoidal tissue. 3. Chronic ethmoid sinusitis. Electronically Signed By: Gaylyn Rong M.D. On: 04/27/2015 15:40   Arvilla Market, DO 04/28/2015, 8:30 AM PGY-1, [facility] Family Medicine FPTS Intern pager: [phone], text pages welcome

## 2015-04-28 NOTE — Progress Notes (Signed)
Left arm PPD measured- 1cm induration.

## 2015-04-28 NOTE — Progress Notes (Signed)
INFECTIOUS DISEASE PROGRESS NOTE  ID: Peter Bailey is a 29 y.o. male with  Active Problems:   Atypical pneumonia   Positive PPD   Acute pharyngitis   Opiate withdrawal  Subjective: Feels better No genital lesions  Abtx:  Anti-infectives    Start     Dose/Rate Route Frequency Ordered Stop   04/27/15 2130  cefTRIAXone (ROCEPHIN) 1 g in dextrose 5 % 50 mL IVPB     1 g 100 mL/hr over 30 Minutes Intravenous Every 24 hours 04/27/15 2044     04/27/15 2130  azithromycin (ZITHROMAX) 500 mg in dextrose 5 % 250 mL IVPB     500 mg 250 mL/hr over 60 Minutes Intravenous Every 24 hours 04/27/15 2044        Medications:  Scheduled: . azithromycin  500 mg Intravenous Q24H  . budesonide (PULMICORT) nebulizer solution  1 mg Nebulization BID  . cefTRIAXone (ROCEPHIN)  IV  1 g Intravenous Q24H  . fondaparinux (ARIXTRA) injection  2.5 mg Subcutaneous Q0600  . methadone  50 mg Oral Daily  . nicotine  14 mg Transdermal Daily  . sodium chloride  3 mL Intravenous Q12H    Objective: Vital signs in last 24 hours: Temp:  [97.6 F (36.4 C)-98.8 F (37.1 C)] 97.6 F (36.4 C) (09/27 0511) Pulse Rate:  [71-109] 100 (09/27 0511) Resp:  [10-20] 19 (09/27 0511) BP: (141-169)/(76-99) 146/76 mmHg (09/27 0511) SpO2:  [94 %-100 %] 99 % (09/27 0936)   General appearance: alert, cooperative and no distress Resp: rhonchi anterior - right Cardio: regular rate and rhythm GI: normal findings: bowel sounds normal and soft, non-tender  Lab Results  Recent Labs  04/27/15 1345 04/28/15 0450  WBC 13.6* 9.6  HGB 14.1 13.2  HCT 41.8 39.6  NA 137 138  K 4.5 4.6  CL 98* 100*  CO2 29 27  BUN 10 7  CREATININE 0.99 0.85   Liver Panel No results for input(s): PROT, ALBUMIN, AST, ALT, ALKPHOS, BILITOT, BILIDIR, IBILI in the last 72 hours. Sedimentation Rate No results for input(s): ESRSEDRATE in the last 72 hours. C-Reactive Protein No results for input(s): CRP in the last 72  hours.  Microbiology: Recent Results (from the past 240 hour(s))  Rapid strep screen     Status: None   Collection Time: 04/27/15  2:06 PM  Result Value Ref Range Status   Streptococcus, Group A Screen (Direct) NEGATIVE NEGATIVE Final    Comment: (NOTE) A Rapid Antigen test may result negative if the antigen level in the sample is below the detection level of this test. The FDA has not cleared this test as a stand-alone test therefore the rapid antigen negative result has reflexed to a Group A Strep culture.     Studies/Results: Dg Chest 2 View  04/27/2015   CLINICAL DATA:  Shortness of breath 4 days.  EXAM: CHEST  2 VIEW  COMPARISON:  02/16/2014  FINDINGS: The heart size and mediastinal contours are within normal limits. Both lungs are clear. The visualized skeletal structures are unremarkable.  IMPRESSION: No active cardiopulmonary disease.   Electronically Signed   By: Elberta Fortis M.D.   On: 04/27/2015 15:03   Ct Soft Tissue Neck W Contrast  04/27/2015   CLINICAL DATA:  Sore throat. Pain. Shortness of breath. Lethargy. Diaphoresis. Positive PPD.  EXAM: CT NECK WITH CONTRAST  TECHNIQUE: Multidetector CT imaging of the neck was performed using the standard protocol following the bolus administration of intravenous contrast.  CONTRAST:  75mL OMNIPAQUE IOHEXOL 300 MG/ML  SOLN  COMPARISON:  07/14/2006  FINDINGS: Pharynx and larynx: Mild prominence of the tonsillar pillars. Mildly prominent soft palate. Moderately prominent lingual tonsils appear grossly symmetric. Small amount of gas centrally tracks along the residual adenoidal tissue.  Epiglottis and ariepiglottic folds normal.  Glottic level normal.  Salivary glands: Unremarkable  Thyroid: Unremarkable  Lymph nodes: Left submandibular node 1.2 cm in short axis, image 65 series 201. Separate adjacent left submandibular lymph node 1.0 cm in short axis, image 63 series 201. Smaller right submandibular lymph nodes are present. Left station 2  lymph node 1.0 cm in short axis, image 56 series 201. Right station 2 lymph node 1.2 cm in short axis, image 43 series 201.  Vascular: Unremarkable  Limited intracranial: Unremarkable  Visualized orbits: Unremarkable  Mastoids and visualized paranasal sinuses: Mild chronic ethmoid sinusitis.  Skeleton: High density in the right C7-T1 neural foramen is thought to represent venous contrast. Otherwise negative.  Upper chest: Patchy airspace opacities in the lung apices, surprisingly difficult to appreciate on chest radiography but multi focal and readily apparent on CT.  IMPRESSION: 1. Multifocal airspace opacities in the lung apices. Appearance concerning for multi focal pneumonia or atypical infectious process. 2. Symmetric prominence of the tonsillar pillars, lingual tonsils, soft no focal tonsillar mass observed. Palate, with mild submandibular and internal jugular adenopathy detailed above, probably reactive/inflammatory. Mild prominence of residual adenoidal tissue. 3. Chronic ethmoid sinusitis.   Electronically Signed   By: Gaylyn Rong M.D.   On: 04/27/2015 15:40     Assessment/Plan: Positive TB Skin Test Pharyngitis Pneumonia Prescription drug dependence  Total days of antibiotics: 1 ceftriaxone/azithro  BCx pending HIV (-) Respiratory virus panel pending Sputum studies pending.  CT chest pending      He and his wife ask about HSV testing. His wife had a Ab test+ last year. Neither of them have ever had a genital lesion. I let them know that serologic dx of HSV is not helpful and that the best test would be to have a swab sent for Cx/PCR from an ulcer.  She is given a mask.      Johny Sax Infectious Diseases (pager) [phone] [email or web address] 04/28/2015, 2:07 PM  LOS: 1 day

## 2015-04-28 NOTE — Progress Notes (Signed)
Sputum cup labelled/placed in room and explained to pt./RN, sample will be 2nd of 3 needed, one already sent prior from CCM notes, RT to monitor.

## 2015-04-28 NOTE — Progress Notes (Signed)
Spoke with [facility] regarding Mr. Wrightsman methadone dosing. His intake was performed on 9/21. Methadone dose was increased to 50 mg yesterday, 9/26. They recommended that he remain on Methadone 50 mg x3 days. After that, recommended increasing the dose by 5-10 mg and keeping him on 55-60 mg x 3 days prior to increasing again.   Marcy Siren, D.O. 04/28/2015, 2:42 PM PGY-1, [facility]

## 2015-04-28 NOTE — Consult Note (Addendum)
Name: Peter Bailey MRN: [id number] DOB: [birth year]    ADMISSION DATE:  04/27/2015 CONSULTATION DATE:  04/28/2015  REFERRING MD :  FPTS  CHIEF COMPLAINT:  Positive PPD and bilateral upper lobes airway disease  BRIEF PATIENT DESCRIPTION: 29 year old male with PMH of being incarcerated who presents with SOB, fever and sweats.  Has history of positive PPD as outpatient.  History of intermittent asthma, bipolar disorder and opiate abuse.  Smoker (0.5-1 PPD).  Since admission patient continued to have fever and chills and leukocytosis.  Head and neck CT demonstrated focal tonsillar mass and diffuse LAN as well as bilateral upper lobe infiltrate.  ID was consulted and recommended a bronch however, patient was able to expectorate one sample already and was sent to lab.  SIGNIFICANT EVENTS  9/26 admission  STUDIES:  9/26 Head/Neck CT with diffuse LAN and pulmonary infiltrate.  HISTORY OF PRESENT ILLNESS:  29 year old male with PMH of being incarcerated who presents with SOB, fever and sweats.  Has history of positive PPD as outpatient.  History of intermittent asthma, bipolar disorder and opiate abuse.  Smoker (0.5-1 PPD).  Since admission patient continued to have fever and chills and leukocytosis.  Head and neck CT demonstrated focal tonsillar mass and diffuse LAN as well as bilateral upper lobe infiltrate.  ID was consulted and recommended a bronch however, patient was able to expectorate one sample already and was sent to lab.  PAST MEDICAL HISTORY :   has a past medical history of Asthma; Bipolar 1 disorder; Shortness of breath; and GERD (gastroesophageal reflux disease).  has past surgical history that includes Appendectomy; Mandible surgery; and ORIF finger fracture (12/02/2011). Prior to Admission medications   Medication Sig Start Date End Date Taking? Authorizing Provider  bacitracin ointment Apply 1 application topically 2 (two) times daily.   Yes Historical Provider, MD    diphenhydramine-acetaminophen (TYLENOL PM) 25-500 MG TABS Take 2 tablets by mouth at bedtime as needed (pain).   Yes Historical Provider, MD  fluticasone (FLOVENT HFA) 220 MCG/ACT inhaler Inhale 2 puffs into the lungs 2 (two) times daily. 08/27/14  Yes Nestor Ramp, MD  methadone (DOLOPHINE) 10 MG/ML solution Take 50 mg by mouth daily.   Yes Historical Provider, MD  Multiple Vitamin (MULTIVITAMIN WITH MINERALS) TABS tablet Take 1 tablet by mouth daily.   Yes Historical Provider, MD  Oxycodone HCl 10 MG TABS Take 10 mg by mouth 3 (three) times daily as needed. 04/20/15  Yes Historical Provider, MD  albuterol (PROAIR HFA) 108 (90 BASE) MCG/ACT inhaler Inhale 2 puffs into the lungs every 3 (three) hours as needed for wheezing or shortness of breath. Patient not taking: Reported on 04/28/2015 08/27/14   Nestor Ramp, MD  cyclobenzaprine (FLEXERIL) 5 MG tablet Take 1 tablet (5 mg total) by mouth 2 (two) times daily. Patient not taking: Reported on 04/28/2015 09/10/14   Nestor Ramp, MD   Allergies  Allergen Reactions  . Ibuprofen Other (See Comments)    Eyes and throat swell  . Pork-Derived Products     FAMILY HISTORY:  family history is negative for Anesthesia problems, Hypotension, Malignant hyperthermia, and Pseudochol deficiency. SOCIAL HISTORY:  reports that he has been smoking Cigarettes.  He has a 5 pack-year smoking history. He has never used smokeless tobacco. He reports that he does not drink alcohol or use illicit drugs.  REVIEW OF SYSTEMS:   12 point ROS is negative other than above.  SUBJECTIVE:   VITAL SIGNS:  Temp:  [97.6 F (36.4 C)-99 F (37.2 C)] 97.6 F (36.4 C) (09/27 0511) Pulse Rate:  [71-109] 100 (09/27 0511) Resp:  [9-22] 19 (09/27 0511) BP: (141-174)/(76-109) 146/76 mmHg (09/27 0511) SpO2:  [94 %-100 %] 99 % (09/27 0936) Weight:  [99.791 kg (220 lb)] 99.791 kg (220 lb) (09/26 1323)  PHYSICAL EXAMINATION: General:  Well appearing, NAD. Neuro:  Alert and interactive.   Moving all ext to command. Head: [place]/AT. EENT:  PERRL, EOM-I and tenderness to neck palpation but unable to palpate significant lymph nodes as seen on CT. Cardiovascular:  RRR, Nl S1/S2, -M/R/G. Lungs:  Bibasilar crackles, otherwise clear. Abdomen:  Soft, NT, ND and +BS. Musculoskeletal:  -edema and -tenderness. Skin:  Intact.   Recent Labs Lab 04/27/15 1345 04/28/15 0450  NA 137 138  K 4.5 4.6  CL 98* 100*  CO2 29 27  BUN 10 7  CREATININE 0.99 0.85  GLUCOSE 116* 131*    Recent Labs Lab 04/27/15 1345 04/28/15 0450  HGB 14.1 13.2  HCT 41.8 39.6  WBC 13.6* 9.6  PLT 333 317   Dg Chest 2 View  04/27/2015   CLINICAL DATA:  Shortness of breath 4 days.  EXAM: CHEST  2 VIEW  COMPARISON:  02/16/2014  FINDINGS: The heart size and mediastinal contours are within normal limits. Both lungs are clear. The visualized skeletal structures are unremarkable.  IMPRESSION: No active cardiopulmonary disease.   Electronically Signed   By: Elberta Fortis M.D.   On: 04/27/2015 15:03   Ct Soft Tissue Neck W Contrast  04/27/2015   CLINICAL DATA:  Sore throat. Pain. Shortness of breath. Lethargy. Diaphoresis. Positive PPD.  EXAM: CT NECK WITH CONTRAST  TECHNIQUE: Multidetector CT imaging of the neck was performed using the standard protocol following the bolus administration of intravenous contrast.  CONTRAST:  75mL OMNIPAQUE IOHEXOL 300 MG/ML  SOLN  COMPARISON:  07/14/2006  FINDINGS: Pharynx and larynx: Mild prominence of the tonsillar pillars. Mildly prominent soft palate. Moderately prominent lingual tonsils appear grossly symmetric. Small amount of gas centrally tracks along the residual adenoidal tissue.  Epiglottis and ariepiglottic folds normal.  Glottic level normal.  Salivary glands: Unremarkable  Thyroid: Unremarkable  Lymph nodes: Left submandibular node 1.2 cm in short axis, image 65 series 201. Separate adjacent left submandibular lymph node 1.0 cm in short axis, image 63 series 201. Smaller  right submandibular lymph nodes are present. Left station 2 lymph node 1.0 cm in short axis, image 56 series 201. Right station 2 lymph node 1.2 cm in short axis, image 43 series 201.  Vascular: Unremarkable  Limited intracranial: Unremarkable  Visualized orbits: Unremarkable  Mastoids and visualized paranasal sinuses: Mild chronic ethmoid sinusitis.  Skeleton: High density in the right C7-T1 neural foramen is thought to represent venous contrast. Otherwise negative.  Upper chest: Patchy airspace opacities in the lung apices, surprisingly difficult to appreciate on chest radiography but multi focal and readily apparent on CT.  IMPRESSION: 1. Multifocal airspace opacities in the lung apices. Appearance concerning for multi focal pneumonia or atypical infectious process. 2. Symmetric prominence of the tonsillar pillars, lingual tonsils, soft no focal tonsillar mass observed. Palate, with mild submandibular and internal jugular adenopathy detailed above, probably reactive/inflammatory. Mild prominence of residual adenoidal tissue. 3. Chronic ethmoid sinusitis.   Electronically Signed   By: Gaylyn Rong M.D.   On: 04/27/2015 15:40   I reviewed head/neck CT myself, LAN noted and bilateral upper lobe infiltrate.   ASSESSMENT / PLAN:  28 year  old male with positive PPD presenting with SOB, fever, chills and cough.  DDx likely active TB given upper lobe infiltrate vs CAP (less likely given bilateral upper lobe infiltrate).  Patient has already expectorated one sample in lab.    Infiltrate:  - PPD+ will send a quantiferon gold.  - ID following.  - Additional samples to be sent.  - Chest CT without contrast ordered.  PPD+:  - ID following, would likely start treatment.  - Send quantiferon gold.  - Patient already provided one sample, will order sputum induction, if unable to produce sputum then will bronch.  Cough/fever/chills.  - Continue rocephin/zithromax.  - F/U cultures and limit as able.  -  Appreciate input from ID.  Discussed with FPTS resident.  Alyson Reedy, M.D. [facility] Pulmonary/Critical Care Medicine. Pager: [phone]. After hours pager: [phone].  04/28/2015, 12:57 PM

## 2015-04-29 LAB — CULTURE, GROUP A STREP: Strep A Culture: NEGATIVE

## 2015-04-29 MED ORDER — WHITE PETROLATUM GEL
Status: AC
  Administered 2015-04-29: 08:00:00
  Filled 2015-04-29: qty 1

## 2015-04-29 MED ORDER — SALINE SPRAY 0.65 % NA SOLN
1.0000 | NASAL | Status: DC | PRN
  Administered 2015-04-29: 1 via NASAL
  Filled 2015-04-29 (×2): qty 44

## 2015-04-29 NOTE — Consult Note (Signed)
   Name: Peter Bailey MRN: [id number] DOB: 09/12/1985    ADMISSION DATE:  04/27/2015 CONSULTATION DATE:  04/28/2015  REFERRING MD :  FPTS  CHIEF COMPLAINT:  Positive PPD and bilateral upper lobes airway disease  BRIEF PATIENT DESCRIPTION: 29 year old male with PMH of being incarcerated who presents with SOB, fever and sweats.  Has history of positive PPD as outpatient.  History of intermittent asthma, bipolar disorder and opiate abuse.  Smoker (0.5-1 PPD).  Since admission patient continued to have fever and chills and leukocytosis.  Head and neck CT demonstrated focal tonsillar mass and diffuse LAN as well as bilateral upper lobe infiltrate.  ID was consulted and recommended a bronch however, patient was able to expectorate one sample already and was sent to lab.  SIGNIFICANT EVENTS  9/26 admission  STUDIES:  9/26 Head/Neck CT with diffuse LAN and pulmonary infiltrate. 9/27 CT chest > mild ground glass opacifications upper lobe only in bronchovascular distribution   SUBJECTIVE: having some nose soreness, wants to have a sputum cup   VITAL SIGNS: Temp:  [98 F (36.7 C)-99.2 F (37.3 C)] 98 F (36.7 C) (09/28 0610) Pulse Rate:  [70-96] 70 (09/28 0610) Resp:  [18-20] 18 (09/28 0610) BP: (114-134)/(66-84) 114/66 mmHg (09/28 0610) SpO2:  [97 %-99 %] 97 % (09/28 0610)  PHYSICAL EXAMINATION: General:  No distress HEENT: NCAT OP clear PULM: CTA B CV: RRR, no mgr GI: soft, nontender Neuro: Awake and alert   Recent Labs Lab 04/27/15 1345 04/28/15 0450  NA 137 138  K 4.5 4.6  CL 98* 100*  CO2 29 27  BUN 10 7  CREATININE 0.99 0.85  GLUCOSE 116* 131*    Recent Labs Lab 04/27/15 1345 04/28/15 0450  HGB 14.1 13.2  HCT 41.8 39.6  WBC 13.6* 9.6  PLT 333 317     ASSESSMENT / PLAN:  29 year old male with positive PPD presenting with SOB, fever, chills and cough.  DDx likely active TB given upper lobe infiltrate vs CAP (less likely given bilateral upper lobe  infiltrate).  Patient continues to produce sputum   CT findings from yesterday are mild and non-specific, I have personally reviewed the images.  Could represent CAP vs less likely TB or an inflammatory process.  The utility of a bronchoscopy would only be to assess for an infectious process at this point.  If the infiltrates do not resolve in the next 6 weeks with antibiotic treatment or if he worsens then we could consider a bronchoscopy.  Infiltrate:  - ID following > I agree with CAP coverage  - Additional sputum samples pending  - Would perform bronchoscopy if unable to produce mucus or if he worsens  - Will need repeat CT chest in 3 months and pulmonary follow up  PPD+:  - per ID following   PCCM will be available PRN  Heber [place], MD [place] P[facility]M Pager: [phone] Cell: [phone] After 3pm or if no response, call [phone]  04/29/2015, 7:52 AM

## 2015-04-29 NOTE — Progress Notes (Signed)
Family Medicine Teaching Service Daily Progress Note Intern Pager: [phone]  Patient name: Peter Bailey Medical record number: [id number] Date of birth: February 05, 1986 Age: 29 y.o. Gender: male  Primary Care Keidrick Murty: Denny Levy, MD Consultants: ID  Code Status: Full   Pt Overview and Major Events to Date:  9/26: Admitted   Assessment and Plan: [name] is a 29 y.o. male presenting with SOB, sore throat, fever, sweats and positive PPD as outpatient. PMH is significant for intermittent asthma, bipolar disorder, opiate abuse, and smoking (0.5-1ppd).   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 Strep screen negative.  CT Head and neck showed multiple bilateral enlarged lymph nodes in the neck, multifocal airspace opacities in the lung apices (which may represent atypical pneumonia vs reactivated TB). HIV and Hep C negative. Unfortunately many of his symptoms could be secondary to opiate withdrawal (please see below). - ID consulted, appreciate recommendations - RVP negative  -Isolation precautions for TB  - AFB culture and gram stain in process  -Quantiferon tb gold in process  -Bcx NG x <24h -supportive care for pharyngitis: chloraseptic throat spray, tylenol  Q6H - ceftriaxone and azithromycin for community acquired pneumonia (9/26 >>) -CT chest: Bilateral upper lobe subsolid nodules and ground glass opacities ( in bronchovascular distrbution). Most likely reglecting an infectious or inflammatory process.  -pulmonology following, appreciate recs >>CT findings mild and nonspecific; additional sputum samples pending; if infiltrates unresolved in 6 weeks with abx tx or if he worsens would consider broch; repeat CT chest in 3 months and pulm f/u   Opiate Abuse and withdrawal: Pt just recently began methadone treatment with [facility]  at [phone]. States he is on  of methadone daily. Many of his symptoms are likely secondary to withdrawal - diaphoresis, diffuse pain, nausea, rhinorrea.  - Will give methadone  today; increase to  tomorrow (9/29)   Tobacco Abuse: 0.5-1ppd - nicotine patch,   Bipolar Depression: Pt was prescribed lithium in the past, but has not been taking this for at least 6 months. - continue to monitor  FEN/GI: Regular diet/ NS @ 100cc/hr Prophylaxis: fondapainux 2.5mg  QD 2/2 religious avoidance of pork  Disposition: Home/continued rehab pending clinical improvement   Subjective:  Feeling better today. Anxious about remaining in the isolation room.    Objective: Temp:  [98 F (36.7 C)-99.2 F (37.3 C)] 98 F (36.7 C) (09/28 0610) Pulse Rate:  [70-96] 70 (09/28 0610) Resp:  [18-20] 18 (09/28 0610) BP: (114-134)/(66-84) 114/66 mmHg (09/28 0610) SpO2:  [97 %-99 %] 97 % (09/28 0610) Physical Exam: General: patient sitting on side of bed, NAD  Throat: Erythematous and tonsils enlarged bilaterally.  Cardiovascular: RRR. No murmurs appreciated.  Respiratory: Occasional wheeze appreciated. Normal WOB.  Abdomen: soft, NTND Extremities: +PPD test in L forearm, red and infurated  Psych: Anxious. Follows commands and answers questions appropriately.   Laboratory:  Recent Labs Lab 04/27/15 1345 04/28/15 0450  WBC 13.6* 9.6  HGB 14.1 13.2  HCT 41.8 39.6  PLT 333 317    Recent Labs Lab 04/27/15 1345 04/28/15 0450  NA 137 138  K 4.5 4.6  CL 98* 100*  CO2 29 27  BUN 10 7  CREATININE 0.99 0.85  CALCIUM 9.6 9.5  GLUCOSE 116* 131*   HIV nonreactive  Rapid GAS negative   Imaging/Diagnostic Tests: EKG: NSR HR 66, T wave inversion in V1  and aVR (stable). QTc 422  Dg Chest 2 View  04/27/2015 CLINICAL DATA: Shortness of breath 4 days. EXAM: CHEST 2 VIEW COMPARISON: 02/16/2014 FINDINGS: The heart size and mediastinal contours are within normal limits. Both lungs  are clear. The visualized skeletal structures are unremarkable. IMPRESSION: No active cardiopulmonary disease. Electronically Signed By: Elberta Fortis M.D. On: 04/27/2015 15:03   Ct Soft Tissue Neck W Contrast  04/27/2015 CLINICAL DATA: Sore throat. Pain. Shortness of breath. Lethargy. Diaphoresis. Positive PPD. EXAM: CT NECK WITH CONTRAST TECHNIQUE: Multidetector CT imaging of the neck was performed using the standard protocol following the bolus administration of intravenous contrast. CONTRAST: 75mL OMNIPAQUE IOHEXOL 300 MG/ML SOLN COMPARISON: 07/14/2006 FINDINGS: Pharynx and larynx: Mild prominence of the tonsillar pillars. Mildly prominent soft palate. Moderately prominent lingual tonsils appear grossly symmetric. Small amount of gas centrally tracks along the residual adenoidal tissue. Epiglottis and ariepiglottic folds normal. Glottic level normal. Salivary glands: Unremarkable Thyroid: Unremarkable Lymph nodes: Left submandibular node 1.2 cm in short axis, image 65 series 201. Separate adjacent left submandibular lymph node 1.0 cm in short axis, image 63 series 201. Smaller right submandibular lymph nodes are present. Left station 2 lymph node 1.0 cm in short axis, image 56 series 201. Right station 2 lymph node 1.2 cm in short axis, image 43 series 201. Vascular: Unremarkable Limited intracranial: Unremarkable Visualized orbits: Unremarkable Mastoids and visualized paranasal sinuses: Mild chronic ethmoid sinusitis. Skeleton: High density in the right C7-T1 neural foramen is thought to represent venous contrast. Otherwise negative. Upper chest: Patchy airspace opacities in the lung apices, surprisingly difficult to appreciate on chest radiography but multi focal and readily apparent on CT. IMPRESSION: 1. Multifocal airspace opacities in the lung apices. Appearance concerning for multi focal pneumonia or atypical infectious process. 2. Symmetric prominence of the tonsillar  pillars, lingual tonsils, soft no focal tonsillar mass observed. Palate, with mild submandibular and internal jugular adenopathy detailed above, probably reactive/inflammatory. Mild prominence of residual adenoidal tissue. 3. Chronic ethmoid sinusitis. Electronically Signed By: Gaylyn Rong M.D. On: 04/27/2015 15:40   CLINICAL DATA: Multi focal airspace opacities. Abnormal chest x-ray. Shortness of breath.  EXAM: CT CHEST WITHOUT CONTRAST  TECHNIQUE: Multidetector CT imaging of the chest was performed following the standard protocol without IV contrast.  COMPARISON: Two-view chest x-ray 04/27/2015.  FINDINGS: The heart size is normal. No significant pleural or pericardial effusion is present. There is no significant mediastinal or axillary adenopathy.  Bilateral gynecomastia is noted, right greater than left. The thoracic inlet is within normal limits. The esophagus is unremarkable. Limited imaging of the upper abdomen is within normal limits.  Scattered bilateral upper lobe sub solid nodules are present. There also peripheral micro nodules. This likely represents an inflammatory process. There is no significant bronchial wall thickening or interlobular septal thickening.  The bone windows are unremarkable. No focal lytic or blastic lesions are present.  IMPRESSION: 1. Bilateral upper lobe subsolid nodules and ground-glass opacities. This most likely reflects an infectious or inflammatory process. Atypical infection is most likely. Cryptogenic organizing pneumonia is considered less likely. Initial follow-up by chest CT without contrast is recommended in 3 months to confirm persistence. This recommendation follows the consensus statement: Recommendations for the Management of Subsolid Pulmonary Nodules Detected at CT: A Statement from the Fleischner Society as published in Radiology 2013; 266:304-317. 2. No significant adenopathy or secondary evidence  for neoplasm.  Arvilla Market, DO 04/29/2015, 8:44 AM PGY-1,  Family Medicine FPTS Intern pager: [phone], text pages welcome

## 2015-04-29 NOTE — Progress Notes (Signed)
INFECTIOUS DISEASE PROGRESS NOTE  ID: Peter Bailey is a 29 y.o. male with  Active Problems:   Atypical pneumonia   Positive PPD   Acute pharyngitis   Opiate withdrawal  Subjective: Feels better.   Abtx:  Anti-infectives    Start     Dose/Rate Route Frequency Ordered Stop   04/27/15 2130  cefTRIAXone (ROCEPHIN) 1 g in dextrose 5 % 50 mL IVPB     1 g 100 mL/hr over 30 Minutes Intravenous Every 24 hours 04/27/15 2044     04/27/15 2130  azithromycin (ZITHROMAX) 500 mg in dextrose 5 % 250 mL IVPB     500 mg 250 mL/hr over 60 Minutes Intravenous Every 24 hours 04/27/15 2044        Medications:  Scheduled: . azithromycin  500 mg Intravenous Q24H  . budesonide (PULMICORT) nebulizer solution  1 mg Nebulization BID  . cefTRIAXone (ROCEPHIN)  IV  1 g Intravenous Q24H  . fondaparinux (ARIXTRA) injection  2.5 mg Subcutaneous Q0600  . methadone  50 mg Oral Daily  . nicotine  14 mg Transdermal Daily  . senna-docusate  1 tablet Oral BID  . sodium chloride  3 mL Intravenous Q12H    Objective: Vital signs in last 24 hours: Temp:  [98 F (36.7 C)-99.2 F (37.3 C)] 99.1 F (37.3 C) (09/28 1529) Pulse Rate:  [70-96] 88 (09/28 1529) Resp:  [18-20] 20 (09/28 1529) BP: (114-134)/(66-85) 132/85 mmHg (09/28 1529) SpO2:  [97 %-99 %] 99 % (09/28 1529)   General appearance: alert, cooperative and no distress Resp: rhonchi bilaterally Cardio: regular rate and rhythm GI: normal findings: bowel sounds normal and soft, non-tender  Lab Results  Recent Labs  04/27/15 1345 04/28/15 0450  WBC 13.6* 9.6  HGB 14.1 13.2  HCT 41.8 39.6  NA 137 138  K 4.5 4.6  CL 98* 100*  CO2 29 27  BUN 10 7  CREATININE 0.99 0.85   Liver Panel No results for input(s): PROT, ALBUMIN, AST, ALT, ALKPHOS, BILITOT, BILIDIR, IBILI in the last 72 hours. Sedimentation Rate No results for input(s): ESRSEDRATE in the last 72 hours. C-Reactive Protein No results for input(s): CRP in the last 72  hours.  Microbiology: Recent Results (from the past 240 hour(s))  Rapid strep screen     Status: None   Collection Time: 04/27/15  2:06 PM  Result Value Ref Range Status   Streptococcus, Group A Screen (Direct) NEGATIVE NEGATIVE Final    Comment: (NOTE) A Rapid Antigen test may result negative if the antigen level in the sample is below the detection level of this test. The FDA has not cleared this test as a stand-alone test therefore the rapid antigen negative result has reflexed to a Group A Strep culture.   Culture, Group A Strep     Status: None   Collection Time: 04/27/15  2:06 PM  Result Value Ref Range Status   Strep A Culture Negative  Final    Comment: (NOTE) Performed At: [facility] [address] [place], [place] [id number] Mila Homer MD WU:[id number]   AFB culture with smear     Status: None (Preliminary result)   Collection Time: 04/27/15  6:04 PM  Result Value Ref Range Status   Specimen Description SPUTUM  Final   Special Requests Normal  Final   Acid Fast Smear   Final    NO ACID FAST BACILLI SEEN Performed at [facility]    Culture   Final  CULTURE WILL BE EXAMINED FOR 6 WEEKS BEFORE ISSUING A FINAL REPORT Performed at [facility]    Report Status PENDING  Incomplete  Respiratory virus panel     Status: None   Collection Time: 04/27/15  6:05 PM  Result Value Ref Range Status   Respiratory Syncytial Virus A Negative Negative Final   Respiratory Syncytial Virus B Negative Negative Final   Influenza A Negative Negative Final   Influenza B Negative Negative Final   Parainfluenza 1 Negative Negative Final   Parainfluenza 2 Negative Negative Final   Parainfluenza 3 Negative Negative Final   Metapneumovirus Negative Negative Final   Rhinovirus Negative Negative Final   Adenovirus Negative Negative Final    Comment: (NOTE) Performed At: [facility] [address] [place], [place] [id number] Mila Homer MD  BJ:[id number]   Culture, blood (routine x 2)     Status: None (Preliminary result)   Collection Time: 04/27/15  9:30 PM  Result Value Ref Range Status   Specimen Description BLOOD LEFT HAND  Final   Special Requests BOTTLES DRAWN AEROBIC AND ANAEROBIC 10CC  Final   Culture NO GROWTH 2 DAYS  Final   Report Status PENDING  Incomplete  Culture, blood (routine x 2)     Status: None (Preliminary result)   Collection Time: 04/27/15  9:35 PM  Result Value Ref Range Status   Specimen Description BLOOD RIGHT ARM  Final   Special Requests BOTTLES DRAWN AEROBIC AND ANAEROBIC 10CC  Final   Culture NO GROWTH 2 DAYS  Final   Report Status PENDING  Incomplete  AFB culture with smear     Status: None (Preliminary result)   Collection Time: 04/28/15  8:37 PM  Result Value Ref Range Status   Specimen Description SPUTUM  Final   Special Requests Normal  Final   Acid Fast Smear   Final    NO ACID FAST BACILLI SEEN Performed at [facility]    Culture   Final    CULTURE WILL BE EXAMINED FOR 6 WEEKS BEFORE ISSUING A FINAL REPORT Performed at [facility]    Report Status PENDING  Incomplete    Studies/Results: Ct Chest Wo Contrast  04/28/2015   CLINICAL DATA:  Multi focal airspace opacities. Abnormal chest x-ray. Shortness of breath.  EXAM: CT CHEST WITHOUT CONTRAST  TECHNIQUE: Multidetector CT imaging of the chest was performed following the standard protocol without IV contrast.  COMPARISON:  Two-view chest x-ray 04/27/2015.  FINDINGS: The heart size is normal. No significant pleural or pericardial effusion is present. There is no significant mediastinal or axillary adenopathy.  Bilateral gynecomastia is noted, right greater than left. The thoracic inlet is within normal limits. The esophagus is unremarkable. Limited imaging of the upper abdomen is within normal limits.  Scattered bilateral upper lobe sub solid nodules are present. There also peripheral micro nodules. This likely  represents an inflammatory process. There is no significant bronchial wall thickening or interlobular septal thickening.  The bone windows are unremarkable. No focal lytic or blastic lesions are present.  IMPRESSION: 1. Bilateral upper lobe subsolid nodules and ground-glass opacities. This most likely reflects an infectious or inflammatory process. Atypical infection is most likely. Cryptogenic organizing pneumonia is considered less likely. Initial follow-up by chest CT without contrast is recommended in 3 months to confirm persistence. This recommendation follows the consensus statement: Recommendations for the Management of Subsolid Pulmonary Nodules Detected at CT: A Statement from the Fleischner Society as published in Radiology 2013;  161:096-045. 2. No significant adenopathy or secondary evidence for neoplasm.   Electronically Signed   By: Marin Roberts M.D.   On: 04/28/2015 15:56     Assessment/Plan: Positive TB Skin Test Pharyngitis Pneumonia Prescription drug dependence  Total days of antibiotics: 2 ceftriaxone/azithro  He has 2 negative sputum AFB Will stop isolation Would treat as CAP respiratory virus panel (-) F/u CT in 3 months, if persistent changes or pt worsening in intervening period, bronch if pulm agrees.  He can f/u at [facility] for his PPD+. Explained to pt  Detox Available as needed.           Johny Sax Infectious Diseases (pager) [phone] [email or web address] 04/29/2015, 4:32 PM  LOS: 2 days

## 2015-04-30 LAB — QUANTIFERON IN TUBE
QUANTIFERON MITOGEN VALUE: 0.84 [IU]/mL
QUANTIFERON NIL VALUE: 0.05 [IU]/mL
QUANTIFERON TB AG VALUE: 0.04 [IU]/mL
QUANTIFERON TB GOLD: NEGATIVE

## 2015-04-30 LAB — QUANTIFERON TB GOLD ASSAY (BLOOD)

## 2015-04-30 MED ORDER — LEVOFLOXACIN 750 MG PO TABS
750.0000 mg | ORAL_TABLET | Freq: Every day | ORAL | Status: DC
  Administered 2015-04-30 – 2015-05-01 (×2): 750 mg via ORAL
  Filled 2015-04-30 (×2): qty 1

## 2015-04-30 MED ORDER — METHADONE HCL 5 MG PO TABS
5.0000 mg | ORAL_TABLET | Freq: Once | ORAL | Status: AC
  Administered 2015-04-30: 5 mg via ORAL
  Filled 2015-04-30: qty 1

## 2015-04-30 MED ORDER — METHADONE HCL 5 MG PO TABS
55.0000 mg | ORAL_TABLET | Freq: Every day | ORAL | Status: DC
  Administered 2015-05-01: 55 mg via ORAL
  Filled 2015-04-30: qty 1
  Filled 2015-04-30: qty 5

## 2015-04-30 NOTE — Progress Notes (Signed)
Family Medicine Teaching Service Daily Progress Note Intern Pager: [phone]  Patient name: Peter Bailey Medical record number: [id number] Date of birth: 04/02/1986 Age: 29 y.o. Gender: male  Primary Care Provider: Denny Levy, MD Consultants: ID  Code Status: Full   Pt Overview and Major Events to Date:  9/26: Admitted   Assessment and Plan: Peter Bailey is a 29 y.o. male presenting with SOB, sore throat, fever, sweats and positive PPD as outpatient. PMH is significant for intermittent asthma, bipolar disorder, opiate abuse, and smoking (0.5-1ppd).   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 Strep screen negative.  CT Head and neck showed multiple bilateral enlarged lymph nodes in the neck, multifocal airspace opacities in the lung apices (which may represent atypical pneumonia vs reactivated TB). HIV and Hep C negative. RVP negative. CT chest: Bilateral upper lobe subsolid nodules and ground glass opacities ( in bronchovascular distrbution). Most likely reglecting an infectious or inflammatory process. F/U CT in 3 months, if persistent changes or patient worsening in interval period, bronch if pulm agrees.  - ID and pulm consulted, appreciate recs >>both following prn  - 2 negative AFB sputum --> stop isolation precautions  -Quantiferon tb gold in process  -Bcx NG x 2d -supportive care for pharyngitis: chloraseptic throat spray, tylenol  Q6H - ceftriaxone and azithromycin for community acquired pneumonia (9/26 >>); switch to oral abx regimen today (9/29)    Opiate Abuse and withdrawal: Pt just recently began methadone treatment with [facility] at [phone].  Many of his symptoms are likely secondary to withdrawal - diaphoresis, diffuse pain, nausea, rhinorrea.  - increased methadone to  daily; keep this dose x3 days (through 10/1)  Tobacco Abuse: 0.5-1ppd - nicotine  patch,   Bipolar Depression: Pt was prescribed lithium in the past, but has not been taking this for at least 6 months. - continue to monitor  FEN/GI: Regular diet/ NS @ 100cc/hr Prophylaxis: fondapainux 2.5mg  QD 2/2 religious avoidance of pork  Disposition: Home/continued rehab pending clinical improvement   Subjective:  Continues to feel better. No complaints at present. Happy to be off isolation precautions as he was getting bad "cabin fever" yesterday.   Objective: Temp:  [98.5 F (36.9 C)-99.1 F (37.3 C)] 98.5 F (36.9 C) (09/29 0500) Pulse Rate:  [85-89] 85 (09/29 0500) Resp:  [19-21] 19 (09/29 0500) BP: (132-150)/(85-101) 147/88 mmHg (09/29 0500) SpO2:  [66 %-99 %] 66 % (09/29 0500) Physical Exam: General: patient sitting on side of bed, NAD  Cardiovascular: RRR. No murmurs appreciated.  Respiratory: CTAB. Normal WOB.  Abdomen: soft, NTND Extremities: +PPD test in L forearm, red and infurated  Psych: Appropriate affect. Follows commands and answers questions appropriately.   Laboratory:  Recent Labs Lab 04/27/15 1345 04/28/15 0450  WBC 13.6* 9.6  HGB 14.1 13.2  HCT 41.8 39.6  PLT 333 317    Recent Labs Lab 04/27/15 1345 04/28/15 0450  NA 137 138  K 4.5 4.6  CL 98* 100*  CO2 29 27  BUN 10 7  CREATININE 0.99 0.85  CALCIUM 9.6 9.5  GLUCOSE 116* 131*   HIV nonreactive  Rapid GAS negative   Imaging/Diagnostic Tests: EKG: NSR HR 66, T wave inversion in V1 and aVR (stable). QTc 422  Dg Chest 2 View  04/27/2015 CLINICAL DATA: Shortness of breath 4 days. EXAM: CHEST 2 VIEW COMPARISON: 02/16/2014 FINDINGS: The heart size and mediastinal contours are within normal limits.  Both lungs are clear. The visualized skeletal structures are unremarkable. IMPRESSION: No active cardiopulmonary disease. Electronically Signed By: Elberta Fortis M.D. On: 04/27/2015 15:03   Ct Soft Tissue Neck W Contrast  04/27/2015 CLINICAL DATA: Sore throat.  Pain. Shortness of breath. Lethargy. Diaphoresis. Positive PPD. EXAM: CT NECK WITH CONTRAST TECHNIQUE: Multidetector CT imaging of the neck was performed using the standard protocol following the bolus administration of intravenous contrast. CONTRAST: 75mL OMNIPAQUE IOHEXOL 300 MG/ML SOLN COMPARISON: 07/14/2006 FINDINGS: Pharynx and larynx: Mild prominence of the tonsillar pillars. Mildly prominent soft palate. Moderately prominent lingual tonsils appear grossly symmetric. Small amount of gas centrally tracks along the residual adenoidal tissue. Epiglottis and ariepiglottic folds normal. Glottic level normal. Salivary glands: Unremarkable Thyroid: Unremarkable Lymph nodes: Left submandibular node 1.2 cm in short axis, image 65 series 201. Separate adjacent left submandibular lymph node 1.0 cm in short axis, image 63 series 201. Smaller right submandibular lymph nodes are present. Left station 2 lymph node 1.0 cm in short axis, image 56 series 201. Right station 2 lymph node 1.2 cm in short axis, image 43 series 201. Vascular: Unremarkable Limited intracranial: Unremarkable Visualized orbits: Unremarkable Mastoids and visualized paranasal sinuses: Mild chronic ethmoid sinusitis. Skeleton: High density in the right C7-T1 neural foramen is thought to represent venous contrast. Otherwise negative. Upper chest: Patchy airspace opacities in the lung apices, surprisingly difficult to appreciate on chest radiography but multi focal and readily apparent on CT. IMPRESSION: 1. Multifocal airspace opacities in the lung apices. Appearance concerning for multi focal pneumonia or atypical infectious process. 2. Symmetric prominence of the tonsillar pillars, lingual tonsils, soft no focal tonsillar mass observed. Palate, with mild submandibular and internal jugular adenopathy detailed above, probably reactive/inflammatory. Mild prominence of residual adenoidal tissue. 3. Chronic ethmoid sinusitis.  Electronically Signed By: Gaylyn Rong M.D. On: 04/27/2015 15:40   CLINICAL DATA: Multi focal airspace opacities. Abnormal chest x-ray. Shortness of breath.  EXAM: CT CHEST WITHOUT CONTRAST  TECHNIQUE: Multidetector CT imaging of the chest was performed following the standard protocol without IV contrast.  COMPARISON: Two-view chest x-ray 04/27/2015.  FINDINGS: The heart size is normal. No significant pleural or pericardial effusion is present. There is no significant mediastinal or axillary adenopathy.  Bilateral gynecomastia is noted, right greater than left. The thoracic inlet is within normal limits. The esophagus is unremarkable. Limited imaging of the upper abdomen is within normal limits.  Scattered bilateral upper lobe sub solid nodules are present. There also peripheral micro nodules. This likely represents an inflammatory process. There is no significant bronchial wall thickening or interlobular septal thickening.  The bone windows are unremarkable. No focal lytic or blastic lesions are present.  IMPRESSION: 1. Bilateral upper lobe subsolid nodules and ground-glass opacities. This most likely reflects an infectious or inflammatory process. Atypical infection is most likely. Cryptogenic organizing pneumonia is considered less likely. Initial follow-up by chest CT without contrast is recommended in 3 months to confirm persistence. This recommendation follows the consensus statement: Recommendations for the Management of Subsolid Pulmonary Nodules Detected at CT: A Statement from the Fleischner Society as published in Radiology 2013; 266:304-317. 2. No significant adenopathy or secondary evidence for neoplasm.  Arvilla Market, DO 04/30/2015, 8:17 AM PGY-1, [place] [facility] FPTS Intern pager: [phone], text pages welcome

## 2015-04-30 NOTE — Discharge Summary (Signed)
Family Medicine Teaching [facility] Discharge Summary  Patient name: Peter Bailey Medical record number: [id number] Date of birth: 1986/06/10 Age: 29 y.o. 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 No murmurs appreciated.  Respiratory: CTAB. Normal WOB.  Abdomen: soft, NTND Psych: Appropriate affect. Follows commands and answers questions appropriately.    Brief Hospital Course:  Peter Bailey is 29 y.o. male who presented with SOB, sore throat, sweats and positive PPD as outpatient. PMH is significant for asthma, bipolar depression, Opiate Abuse, and Tobacco Abuse.   ID consulted and felt rapid onset of illness suggests viral pharyngitis. Strep screen negative. CT Head and neck showed multiple bilateral enlarged lymph nodes in the neck, multifocal airspace opacities in the lung apices. Thought to represent CAP vs. Reactivated TB. CT chest: Bilateral upper lobe subsolid nodules and ground glass opacities ( in bronchovascular distrbution). Most likely reflecting an infectious or inflammatory process. Pulmonology was consulted and did not feel a bronchoscopy was necessary unless patient worsened. Recommended follow up CT of chest in 3 month and pulmonary outpatient.  At admission patient had leukocytosis of 13.6. He was started on Ceftriaxone and Azithromycin. He remained afebrile throughout hospital course and leukocytosis resolved. He was  transitioned to Levaquin on hospital day #4. Blood cultures were no growth x3 day at discharge. With supportive care and antibiotic regimen, patient greatly improved. Patient had 3 preliminary negative AFB sputum and negative Quantiferon Gold. Therefore, he was not treated for active or latent TB.   Issues for Follow Up:  1. CAP: He was discharged with 2 more doses of Levaquin to complete a 7 day course. Follow up on symptoms.  2. Needs CT of chest in 3 months and pulmonary follow up.  3. Patient was given his Methadone during hospital stay. He is followed by [facility] and methadone is prescribed through their treatment center.   Significant Procedures: None   Significant Labs and Imaging:   Recent Labs Lab 04/27/15 1345 04/28/15 0450  WBC 13.6* 9.6  HGB 14.1 13.2  HCT 41.8 39.6  PLT 333 317    Recent Labs Lab 04/27/15 1345 04/28/15 0450  NA 137 138  K 4.5 4.6  CL 98* 100*  CO2 29 27  GLUCOSE 116* 131*  BUN 10 7  CREATININE 0.99 0.85  CALCIUM 9.6 9.5   HIV nonreactive  Rapid GAS negative  Hepatitis C negative  RVP negative   Dg Chest 2 View  04/27/2015 CLINICAL DATA: Shortness of breath 4 days. EXAM: CHEST 2 VIEW COMPARISON: 02/16/2014 FINDINGS: The heart size and mediastinal contours are within normal limits. Both lungs are clear. The visualized skeletal structures are unremarkable. IMPRESSION: No active cardiopulmonary disease. Electronically Signed By: Peter Bailey M.D. On: 04/27/2015 15:03   Ct Soft Tissue Neck W Contrast  04/27/2015 CLINICAL DATA: Sore throat. Pain. Shortness of breath. Lethargy. Diaphoresis. Positive PPD. EXAM: CT NECK WITH CONTRAST TECHNIQUE: Multidetector CT imaging of the neck was performed using the standard protocol following the bolus administration  of intravenous contrast. CONTRAST: 75mL OMNIPAQUE IOHEXOL 300 MG/ML SOLN COMPARISON: 07/14/2006 FINDINGS: Pharynx and larynx: Mild prominence of the  tonsillar pillars. Mildly prominent soft palate. Moderately prominent lingual tonsils appear grossly symmetric. Small amount of gas centrally tracks along the residual adenoidal tissue. Epiglottis and ariepiglottic folds normal. Glottic level normal. Salivary glands: Unremarkable Thyroid: Unremarkable Lymph nodes: Left submandibular node 1.2 cm in short axis, image 65 series 201. Separate adjacent left submandibular lymph node 1.0 cm in short axis, image 63 series 201. Smaller right submandibular lymph nodes are present. Left station 2 lymph node 1.0 cm in short axis, image 56 series 201. Right station 2 lymph node 1.2 cm in short axis, image 43 series 201. Vascular: Unremarkable Limited intracranial: Unremarkable Visualized orbits: Unremarkable Mastoids and visualized paranasal sinuses: Mild chronic ethmoid sinusitis. Skeleton: High density in the right C7-T1 neural foramen is thought to represent venous contrast. Otherwise negative. Upper chest: Patchy airspace opacities in the lung apices, surprisingly difficult to appreciate on chest radiography but multi focal and readily apparent on CT. IMPRESSION: 1. Multifocal airspace opacities in the lung apices. Appearance concerning for multi focal pneumonia or atypical infectious process. 2. Symmetric prominence of the tonsillar pillars, lingual tonsils, soft no focal tonsillar mass observed. Palate, with mild submandibular and internal jugular adenopathy detailed above, probably reactive/inflammatory. Mild prominence of residual adenoidal tissue. 3. Chronic ethmoid sinusitis. Electronically Signed By: Peter Bailey M.D. On: 04/27/2015 15:40   CLINICAL DATA: Multi focal airspace opacities. Abnormal chest x-ray. Shortness of breath.  EXAM: CT CHEST WITHOUT CONTRAST  TECHNIQUE: Multidetector CT imaging of the chest was performed following the standard protocol without IV contrast.  COMPARISON: Two-view chest x-ray  04/27/2015.  FINDINGS: The heart size is normal. No significant pleural or pericardial effusion is present. There is no significant mediastinal or axillary adenopathy.  Bilateral gynecomastia is noted, right greater than left. The thoracic inlet is within normal limits. The esophagus is unremarkable. Limited imaging of the upper abdomen is within normal limits.  Scattered bilateral upper lobe sub solid nodules are present. There also peripheral micro nodules. This likely represents an inflammatory process. There is no significant bronchial wall thickening or interlobular septal thickening.  The bone windows are unremarkable. No focal lytic or blastic lesions are present.  IMPRESSION: 1. Bilateral upper lobe subsolid nodules and ground-glass opacities. This most likely reflects an infectious or inflammatory process. Atypical infection is most likely. Cryptogenic organizing pneumonia is considered less likely. Initial follow-up by chest CT without contrast is recommended in 3 months to confirm persistence. This recommendation follows the consensus statement: Recommendations for the Management of Subsolid Pulmonary Nodules Detected at CT: A Statement from the Fleischner Society as published in Radiology 2013; 266:304-317. 2. No significant adenopathy or secondary evidence for neoplasm.  Results/Tests Pending at Time of Discharge: Final Blood cultures, final AFB sputum reads x3   Discharge Medications:    Medication List    TAKE these medications        albuterol 108 (90 BASE) MCG/ACT inhaler  Commonly known as:  PROAIR HFA  Inhale 2 puffs into the lungs every 3 (three) hours as needed for wheezing or shortness of breath.     bacitracin ointment  Apply 1 application topically 2 (two) times daily.     cyclobenzaprine 5 MG tablet  Commonly known as:  FLEXERIL  Take 1 tablet (5 mg total) by mouth 2 (two) times daily.     diphenhydramine-acetaminophen 25-500 MG Tabs  tablet  Commonly known as:  TYLENOL  PM  Take 2 tablets by mouth at bedtime as needed (pain).     fluticasone 220 MCG/ACT inhaler  Commonly known as:  FLOVENT HFA  Inhale 2 puffs into the lungs 2 (two) times daily.     levofloxacin 750 MG tablet  Commonly known as:  LEVAQUIN  Take 1 tablet (750 mg total) by mouth daily.  Start taking on:  05/02/2015     methadone 10 MG/ML solution  Commonly known as:  DOLOPHINE  Take 50 mg by mouth daily.     multivitamin with minerals Tabs tablet  Take 1 tablet by mouth daily.     Oxycodone HCl 10 MG Tabs  Take 10 mg by mouth 3 (three) times daily as needed.        Discharge Instructions: Please refer to Patient Instructions section of EMR for full details.  Patient was counseled important signs and symptoms that should prompt return to medical care, changes in medications, dietary instructions, activity restrictions, and follow up appointments.   Follow-Up Appointments: Follow-up Information    Follow up with [facility]. Go on 05/14/2015.   Specialty:  Family Medicine   Why:  For Hospital Followup; Dr. Jarvis Newcomer @ [address] information:   [address] [id number] Wilhe[place] Bonito [place] [address] [phone]      Follow up with [facility] P[facility]. Schedule an appointment as soon as possible for a visit in 3 months.   Why:  For Hospital Followup; repeat chest CT   Contact information:   [address] [place] [address]       Arvilla Market, DO 05/01/2015, 11:23 AM PGY-1, [facility]

## 2015-05-01 MED ORDER — LEVOFLOXACIN 750 MG PO TABS: 750.0000 mg | ORAL_TABLET | Freq: Every day | ORAL | Status: AC

## 2015-05-01 NOTE — Progress Notes (Signed)
Pt scheduled for discharge this pm. No concerns voiced

## 2015-05-01 NOTE — Discharge Instructions (Signed)
Please take two more doses of Levaquin on 10/1 and 10/2. You will need to follow up with the lung doctors in 3 months for a repeat CT scan. You can go to the health department regarding TB testing for your fiance.

## 2015-05-01 NOTE — Progress Notes (Signed)
Family Medicine Teaching Service Daily Progress Note Intern Pager: [phone]  Patient name: Peter Bailey Medical record number: [id number] Date of birth: 10/30/1985 Age: 29 y.o. Gender: male  Primary Care Provider: Denny Levy, MD Consultants: ID, Pulmonology  Code Status: Full   Pt Overview and Major Events to Date:  9/26: Admitted  9/29: Transitioned from Ceftriaxone and Azithromycin ---> Levaquin   Assessment and Plan: Peter Bailey is a 29 y.o. male presenting with SOB, sore throat, fever, sweats and positive PPD as outpatient. PMH is significant for intermittent asthma, bipolar disorder, opiate abuse, and smoking (0.5-1ppd).   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 Strep screen negative.  CT Head and neck showed multiple bilateral enlarged lymph nodes in the neck, multifocal airspace opacities in the lung apices (which may represent atypical pneumonia vs reactivated TB). HIV and Hep C negative. RVP negative. CT chest: Bilateral upper lobe subsolid nodules and ground glass opacities ( in bronchovascular distrbution). Most likely reglecting an infectious or inflammatory process. F/U CT in 3 months, if persistent changes or patient worsening in interval period, bronch if pulm agrees.  - ID and pulm consulted, appreciate recs >>both following prn  - 3 negative AFB sputum --> stop isolation precautions  -Quantiferon tb gold negative  -Bcx NG x 3d -supportive care for pharyngitis: chloraseptic throat spray, tylenol  Q6H - ceftriaxone and azithromycin for community acquired pneumonia (9/26 >>); switch to oral levaquin (9/29>>); recommend completing 7 days total of abx ending on 10/2 -repeat EKG to monitor QT interval with methadone and levaquin    Opiate Abuse and withdrawal: Pt just recently began methadone treatment with [facility] at [phone].  Many of his symptoms are  likely secondary to withdrawal - diaphoresis, diffuse pain, nausea, rhinorrea.  - increased methadone to  daily; keep this dose x3 days (through 10/1)  Tobacco Abuse: 0.5-1ppd - nicotine patch,   Bipolar Depression: Pt was prescribed lithium in the past, but has not been taking this for at least 6 months. - continue to monitor  FEN/GI: Regular diet/ NS @ 100cc/hr Prophylaxis: fondapainux 2.5mg  QD 2/2 religious avoidance of pork  Disposition: Home/continued outpatient drug rehab today   Subjective:  Feels much better. Throat is no longer sore. Has been ambulating much better.    Objective: Temp:  [97.7 F (36.5 C)-98.4 F (36.9 C)] 97.7 F (36.5 C) (09/30 0604) Pulse Rate:  [80-85] 82 (09/30 0604) Resp:  [17-18] 17 (09/30 0604) BP: (109-140)/(63-88) 109/63 mmHg (09/30 0604) SpO2:  [93 %-99 %] 98 % (09/30 0604) Physical Exam: General: patient sitting on side of bed, NAD  Cardiovascular: RRR. No murmurs appreciated.  Respiratory: CTAB. Normal WOB.  Abdomen: soft, NTND Psych: Appropriate affect. Follows commands and answers questions appropriately.   Laboratory:  Recent Labs Lab 04/27/15 1345 04/28/15 0450  WBC 13.6* 9.6  HGB 14.1 13.2  HCT 41.8 39.6  PLT 333 317    Recent Labs Lab 04/27/15 1345 04/28/15 0450  NA 137 138  K 4.5 4.6  CL 98* 100*  CO2 29 27  BUN 10 7  CREATININE 0.99 0.85  CALCIUM 9.6 9.5  GLUCOSE 116* 131*   HIV nonreactive  Rapid GAS negative   Imaging/Diagnostic Tests: EKG: NSR HR 66, T wave inversion in V1 and aVR (stable). QTc 422  Dg Chest 2 View  04/27/2015 CLINICAL DATA: Shortness of breath 4 days. EXAM: CHEST 2 VIEW COMPARISON: 02/16/2014 FINDINGS: The  heart size and mediastinal contours are within normal limits. Both lungs are clear. The visualized skeletal structures are unremarkable. IMPRESSION: No active cardiopulmonary disease. Electronically Signed By: Elberta Fortis M.D. On: 04/27/2015 15:03   Ct  Soft Tissue Neck W Contrast  04/27/2015 CLINICAL DATA: Sore throat. Pain. Shortness of breath. Lethargy. Diaphoresis. Positive PPD. EXAM: CT NECK WITH CONTRAST TECHNIQUE: Multidetector CT imaging of the neck was performed using the standard protocol following the bolus administration of intravenous contrast. CONTRAST: 75mL OMNIPAQUE IOHEXOL 300 MG/ML SOLN COMPARISON: 07/14/2006 FINDINGS: Pharynx and larynx: Mild prominence of the tonsillar pillars. Mildly prominent soft palate. Moderately prominent lingual tonsils appear grossly symmetric. Small amount of gas centrally tracks along the residual adenoidal tissue. Epiglottis and ariepiglottic folds normal. Glottic level normal. Salivary glands: Unremarkable Thyroid: Unremarkable Lymph nodes: Left submandibular node 1.2 cm in short axis, image 65 series 201. Separate adjacent left submandibular lymph node 1.0 cm in short axis, image 63 series 201. Smaller right submandibular lymph nodes are present. Left station 2 lymph node 1.0 cm in short axis, image 56 series 201. Right station 2 lymph node 1.2 cm in short axis, image 43 series 201. Vascular: Unremarkable Limited intracranial: Unremarkable Visualized orbits: Unremarkable Mastoids and visualized paranasal sinuses: Mild chronic ethmoid sinusitis. Skeleton: High density in the right C7-T1 neural foramen is thought to represent venous contrast. Otherwise negative. Upper chest: Patchy airspace opacities in the lung apices, surprisingly difficult to appreciate on chest radiography but multi focal and readily apparent on CT. IMPRESSION: 1. Multifocal airspace opacities in the lung apices. Appearance concerning for multi focal pneumonia or atypical infectious process. 2. Symmetric prominence of the tonsillar pillars, lingual tonsils, soft no focal tonsillar mass observed. Palate, with mild submandibular and internal jugular adenopathy detailed above, probably reactive/inflammatory. Mild prominence of  residual adenoidal tissue. 3. Chronic ethmoid sinusitis. Electronically Signed By: Gaylyn Rong M.D. On: 04/27/2015 15:40   CLINICAL DATA: Multi focal airspace opacities. Abnormal chest x-ray. Shortness of breath.  EXAM: CT CHEST WITHOUT CONTRAST  TECHNIQUE: Multidetector CT imaging of the chest was performed following the standard protocol without IV contrast.  COMPARISON: Two-view chest x-ray 04/27/2015.  FINDINGS: The heart size is normal. No significant pleural or pericardial effusion is present. There is no significant mediastinal or axillary adenopathy.  Bilateral gynecomastia is noted, right greater than left. The thoracic inlet is within normal limits. The esophagus is unremarkable. Limited imaging of the upper abdomen is within normal limits.  Scattered bilateral upper lobe sub solid nodules are present. There also peripheral micro nodules. This likely represents an inflammatory process. There is no significant bronchial wall thickening or interlobular septal thickening.  The bone windows are unremarkable. No focal lytic or blastic lesions are present.  IMPRESSION: 1. Bilateral upper lobe subsolid nodules and ground-glass opacities. This most likely reflects an infectious or inflammatory process. Atypical infection is most likely. Cryptogenic organizing pneumonia is considered less likely. Initial follow-up by chest CT without contrast is recommended in 3 months to confirm persistence. This recommendation follows the consensus statement: Recommendations for the Management of Subsolid Pulmonary Nodules Detected at CT: A Statement from the Fleischner Society as published in Radiology 2013; 266:304-317. 2. No significant adenopathy or secondary evidence for neoplasm.  Arvilla Market, DO 05/01/2015, 8:18 AM PGY-1, [facility]PT[facility] Intern pager: [phone], text pages welcome

## 2015-05-01 NOTE — Progress Notes (Signed)
Pt discharged home in stable condition this pm. Wife provided transportation

## 2015-05-01 NOTE — Care Management Note (Signed)
Case Management Note  Patient Details  Name: Peter Bailey MRN: [id number] Date of Birth: 13-Mar-1986  Subjective/Objective:                    Action/Plan: MATCH letter explained to patient , patient voiced understanding   Expected Discharge Date:                  Expected Discharge Plan:  Home/Self Care  In-House Referral:     Discharge planning Services  CM Consult, [facility] Program  Post Acute Care Choice:    Choice offered to:     DME Arranged:    DME Agency:     HH Arranged:    HH Agency:     Status of Service:  Completed, signed off  Medicare Important Message Given:    Date Medicare IM Given:    Medicare IM give by:    Date Additional Medicare IM Given:    Additional Medicare Important Message give by:     If discussed at Long Length of Stay Meetings, dates discussed:    Additional Comments:  Kingsley Plan, RN 05/01/2015, 12:18 PM

## 2015-05-02 LAB — CULTURE, BLOOD (ROUTINE X 2)
CULTURE: NO GROWTH
Culture: NO GROWTH

## 2015-05-14 ENCOUNTER — Inpatient Hospital Stay: Payer: Self-pay | Admitting: Family Medicine

## 2015-05-14 ENCOUNTER — Telehealth: Payer: Self-pay

## 2015-05-14 NOTE — Telephone Encounter (Signed)
[facility]. 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 [facility] is going to fax the report. I have informed Dr. Jennette KettleNeal. Sunday SpillersSharon T Saunders, CMA

## 2015-05-15 ENCOUNTER — Telehealth: Payer: Self-pay | Admitting: Family Medicine

## 2015-05-15 DIAGNOSIS — A310 Pulmonary mycobacterial infection: Secondary | ICD-10-CM | POA: Insufficient documentation

## 2015-05-15 NOTE — Telephone Encounter (Signed)
Referral has been sent to [facility], and I will call them at 9:30 am when their phones are on to advised their office of this urgent referral.

## 2015-05-15 NOTE — Telephone Encounter (Signed)
Phone number in chart is not working--I called his GM (fran Katrinka BlazingSmith) and she is supposed to call back w a working number for him. I have put in referral for Inf Disease--He needs to be seen next week--just received a sputum culture (from where he was hospitalized) that grew out Mycobacterium avium intracellulare (MAC). THANKS! Denny LevySara Jaimya Feliciano

## 2015-06-07 LAB — AFB CULTURE WITH SMEAR (NOT AT ARMC)
Acid Fast Smear: NONE SEEN
Special Requests: NORMAL

## 2015-06-11 LAB — AFB CULTURE WITH SMEAR (NOT AT ARMC)
Acid Fast Smear: NONE SEEN
Acid Fast Smear: NONE SEEN
SPECIAL REQUESTS: NORMAL
Special Requests: NORMAL

## 2015-07-07 ENCOUNTER — Emergency Department (HOSPITAL_COMMUNITY)
Admission: EM | Admit: 2015-07-07 | Discharge: 2015-07-08 | Disposition: A | Discharge status: Home / Self Care | Payer: Self-pay | Attending: Emergency Medicine | Admitting: Emergency Medicine

## 2015-07-07 ENCOUNTER — Ambulatory Visit: Payer: Self-pay | Admitting: Internal Medicine

## 2015-07-07 ENCOUNTER — Encounter (HOSPITAL_COMMUNITY): Payer: Self-pay | Admitting: Emergency Medicine

## 2015-07-07 ENCOUNTER — Emergency Department (HOSPITAL_COMMUNITY): Payer: Self-pay

## 2015-07-07 DIAGNOSIS — R05 Cough: Secondary | ICD-10-CM

## 2015-07-07 DIAGNOSIS — J45901 Unspecified asthma with (acute) exacerbation: Secondary | ICD-10-CM | POA: Insufficient documentation

## 2015-07-07 DIAGNOSIS — J069 Acute upper respiratory infection, unspecified: Secondary | ICD-10-CM | POA: Insufficient documentation

## 2015-07-07 DIAGNOSIS — M791 Myalgia: Secondary | ICD-10-CM | POA: Insufficient documentation

## 2015-07-07 DIAGNOSIS — Z79899 Other long term (current) drug therapy: Secondary | ICD-10-CM | POA: Insufficient documentation

## 2015-07-07 DIAGNOSIS — Z792 Long term (current) use of antibiotics: Secondary | ICD-10-CM | POA: Insufficient documentation

## 2015-07-07 DIAGNOSIS — F17210 Nicotine dependence, cigarettes, uncomplicated: Secondary | ICD-10-CM | POA: Insufficient documentation

## 2015-07-07 DIAGNOSIS — R0602 Shortness of breath: Secondary | ICD-10-CM

## 2015-07-07 DIAGNOSIS — Z7951 Long term (current) use of inhaled steroids: Secondary | ICD-10-CM | POA: Insufficient documentation

## 2015-07-07 DIAGNOSIS — Z8719 Personal history of other diseases of the digestive system: Secondary | ICD-10-CM | POA: Insufficient documentation

## 2015-07-07 DIAGNOSIS — J40 Bronchitis, not specified as acute or chronic: Secondary | ICD-10-CM

## 2015-07-07 DIAGNOSIS — F319 Bipolar disorder, unspecified: Secondary | ICD-10-CM | POA: Insufficient documentation

## 2015-07-07 DIAGNOSIS — R058 Other specified cough: Secondary | ICD-10-CM

## 2015-07-07 LAB — COMPREHENSIVE METABOLIC PANEL
ALK PHOS: 73 U/L (ref 38–126)
ALT: 17 U/L (ref 17–63)
ANION GAP: 10 (ref 5–15)
AST: 9 U/L — ABNORMAL LOW (ref 15–41)
Albumin: 4.2 g/dL (ref 3.5–5.0)
BILIRUBIN TOTAL: 0.5 mg/dL (ref 0.3–1.2)
BUN: 8 mg/dL (ref 6–20)
CALCIUM: 9.9 mg/dL (ref 8.9–10.3)
CO2: 27 mmol/L (ref 22–32)
CREATININE: 1.07 mg/dL (ref 0.61–1.24)
Chloride: 102 mmol/L (ref 101–111)
GFR calc non Af Amer: >60 mL/min (ref >60)
Glucose, Bld: 102 mg/dL — ABNORMAL HIGH (ref 65–99)
Potassium: 4.2 mmol/L (ref 3.5–5.1)
Sodium: 139 mmol/L (ref 135–145)
Total Protein: 7.8 g/dL (ref 6.5–8.1)

## 2015-07-07 LAB — CBC WITH DIFFERENTIAL/PLATELET
Basophils Absolute: 0 10*3/uL (ref 0.0–0.1)
Basophils Relative: 0 %
Eosinophils Absolute: 0.2 10*3/uL (ref 0.0–0.7)
Eosinophils Relative: 1 %
HEMATOCRIT: 43.9 % (ref 39.0–52.0)
HEMOGLOBIN: 14.9 g/dL (ref 13.0–17.0)
LYMPHS ABS: 2.9 10*3/uL (ref 0.7–4.0)
LYMPHS PCT: 26 %
MCH: 31.8 pg (ref 26.0–34.0)
MCHC: 33.9 g/dL (ref 30.0–36.0)
MCV: 93.8 fL (ref 78.0–100.0)
MONOS PCT: 6 %
Monocytes Absolute: 0.7 10*3/uL (ref 0.1–1.0)
NEUTROS ABS: 7.4 10*3/uL (ref 1.7–7.7)
NEUTROS PCT: 67 %
Platelets: 377 10*3/uL (ref 150–400)
RBC: 4.68 MIL/uL (ref 4.22–5.81)
RDW: 13.2 % (ref 11.5–15.5)
WBC: 11.2 10*3/uL — ABNORMAL HIGH (ref 4.0–10.5)

## 2015-07-07 MED ORDER — PREDNISONE 20 MG PO TABS
60.0000 mg | ORAL_TABLET | Freq: Once | ORAL | Status: AC
Start: 2015-07-07 — End: 2015-07-08
  Administered 2015-07-08: 60 mg via ORAL
  Filled 2015-07-07: qty 3

## 2015-07-07 MED ORDER — ALBUTEROL SULFATE HFA 108 (90 BASE) MCG/ACT IN AERS: 2.0000 | INHALATION_SPRAY | RESPIRATORY_TRACT | Status: DC | PRN

## 2015-07-07 MED ORDER — ALBUTEROL SULFATE (2.5 MG/3ML) 0.083% IN NEBU
INHALATION_SOLUTION | RESPIRATORY_TRACT | Status: AC
  Filled 2015-07-07: qty 6

## 2015-07-07 MED ORDER — ALBUTEROL SULFATE (2.5 MG/3ML) 0.083% IN NEBU
5.0000 mg | INHALATION_SOLUTION | Freq: Once | RESPIRATORY_TRACT | Status: AC
  Administered 2015-07-07: 5 mg via RESPIRATORY_TRACT

## 2015-07-07 MED ORDER — SALINE SPRAY 0.65 % NA SOLN
1.0000 | Freq: Once | NASAL | Status: AC
  Administered 2015-07-08: 1 via NASAL
  Filled 2015-07-07: qty 44

## 2015-07-07 NOTE — ED Notes (Addendum)
Pt. reports productive cough , SOB , chest congestion  , sore throat , wheezing, body aches onset this week , denies fever or chills.

## 2015-07-07 NOTE — ED Provider Notes (Signed)
CSN: [id number]     Arrival date & time 07/07/15  2004 History   By signing my name below, I, Arlan Organ, attest that this documentation has been prepared under the direction and in the presence of Shon Baton, MD.  Electronically Signed: Arlan Organ, ED Scribe. 07/07/2015. 11:39 PM.   Chief Complaint  Patient presents with  . Cough  . Wheezing  . Shortness of Breath   The history is provided by the patient. No language interpreter was used.    HPI Comments: Peter Bailey is a 29 y.o. male with a PMHx of asthma who presents to the Emergency Department complaining of intermittent, ongoing shortness of breath x 2 weeks. Pt states "it feels like my throat is closing up". He also reports ongoing chest congestion, HA, sore throat, wheezing, and myalgias. No aggravating or alleviating factors at this time. No OTC medications or home remedies attempted prior to arrival. However, pt was given a breathing treatment in triage which gave some temporary improvement. No recent fever, chills, nausea, vomiting, or chest pain. Pt was recently on antibiotics for PNA diagnosed 2 months ago. Currently pt does not have an inhaler to use at home. Pt with known allergy to Ibuprofen.   PCP: Denny Levy, MD    Past Medical History  Diagnosis Date  . Asthma   . Bipolar 1 disorder (HCC)   . Shortness of breath   . GERD (gastroesophageal reflux disease)     OTC meds   Past Surgical History  Procedure Laterality Date  . Appendectomy    . Mandible surgery    . Orif finger fracture  12/02/2011    Procedure: OPEN REDUCTION INTERNAL FIXATION (ORIF) METACARPAL (FINGER) FRACTURE;  Surgeon: Marlowe Shores, MD;  Location: [facility] OR;  Service: Orthopedics;  Laterality: Right;  right ring metatarsal   Family History  Problem Relation Age of Onset  . Anesthesia problems Neg Hx   . Hypotension Neg Hx   . Malignant hyperthermia Neg Hx   . Pseudochol deficiency Neg Hx    Social History  Substance Use Topics   . Smoking status: Current Every Day Smoker -- 0.00 packs/day for 0 years    Types: Cigarettes  . Smokeless tobacco: Never Used  . Alcohol Use: No    Review of Systems  Constitutional: Negative for fever and chills.  HENT: Positive for sore throat.   Respiratory: Positive for cough, shortness of breath and wheezing.   Cardiovascular: Negative for chest pain.  Gastrointestinal: Negative for nausea, vomiting and abdominal pain.  Musculoskeletal: Positive for myalgias.  Psychiatric/Behavioral: Negative for confusion.  All other systems reviewed and are negative.     Allergies  Ibuprofen and Pork-derived products  Home Medications   Prior to Admission medications   Medication Sig Start Date End Date Taking? Authorizing Provider  bacitracin ointment Apply 1 application topically 2 (two) times daily.   Yes Historical Provider, MD  diphenhydramine-acetaminophen (TYLENOL PM) 25-500 MG TABS Take 2 tablets by mouth at bedtime as needed (pain).   Yes Historical Provider, MD  fluticasone (FLOVENT HFA) 220 MCG/ACT inhaler Inhale 2 puffs into the lungs 2 (two) times daily. 08/27/14  Yes Nestor Ramp, MD  Multiple Vitamin (MULTIVITAMIN WITH MINERALS) TABS tablet Take 1 tablet by mouth daily.   Yes Historical Provider, MD  albuterol (PROVENTIL HFA;VENTOLIN HFA) 108 (90 BASE) MCG/ACT inhaler Inhale 2 puffs into the lungs every 4 (four) hours as needed for wheezing or shortness of breath. 07/08/15   Toni Amend  F Horton, MD  predniSONE (DELTASONE) 20 MG tablet Take 3 tablets (60 mg total) by mouth daily with breakfast. 07/08/15   Shon Baton, MD  sodium chloride (OCEAN) 0.65 % SOLN nasal spray Place 1 spray into both nostrils as needed for congestion. 07/08/15   Shon Baton, MD   Triage Vitals: BP 134/91 mmHg  Pulse 94  Temp(Src) 98.7 F (37.1 C) (Oral)  Resp 20  SpO2 99%   Physical Exam  Constitutional: He is oriented to person, place, and time. He appears well-developed and  well-nourished. No distress.  HENT:  Head: Normocephalic and atraumatic.  Mouth/Throat: No oropharyngeal exudate.  Mild posterior erythema of the oropharynx, no tonsillar exudate, mucus noted draining down the back of the throat, uvula midline, no tonsillar swelling  Eyes: Pupils are equal, round, and reactive to light.  Cardiovascular: Normal rate, regular rhythm and normal heart sounds.   No murmur heard. Pulmonary/Chest: Effort normal and breath sounds normal. No respiratory distress. He has no wheezes.  Abdominal: Soft. Bowel sounds are normal. There is no tenderness. There is no rebound.  Musculoskeletal: He exhibits no edema.  Neurological: He is alert and oriented to person, place, and time.  Skin: Skin is warm and dry.  Psychiatric: He has a normal mood and affect.  Nursing note and vitals reviewed.   ED Course  Procedures (including critical care time)  DIAGNOSTIC STUDIES: Oxygen Saturation is 98% on RA, Normal by my interpretation.    COORDINATION OF CARE: 11:32 PM- Will give breathing treatment. Will order CBC and CMP. Discussed treatment plan with pt at bedside and pt agreed to plan.     Labs Review Labs Reviewed  CBC WITH DIFFERENTIAL/PLATELET - Abnormal; Notable for the following:    WBC 11.2 (*)    All other components within normal limits  COMPREHENSIVE METABOLIC PANEL - Abnormal; Notable for the following:    Glucose, Bld 102 (*)    AST 9 (*)    All other components within normal limits  RAPID STREP SCREEN (NOT AT [facility])  CULTURE, GROUP A STREP    Imaging Review Dg Chest 2 View  07/07/2015  CLINICAL DATA:  29 year old male with shortness of breath and productive cough EXAM: CHEST  2 VIEW COMPARISON:  chest CT dated 04/28/2015 FINDINGS: The heart size and mediastinal contours are within normal limits. Both lungs are clear. The visualized skeletal structures are unremarkable. IMPRESSION: No active cardiopulmonary disease. Electronically Signed   By: Elgie Collard M.D.   On: 07/07/2015 21:08   I have personally reviewed and evaluated these images and lab results as part of my medical decision-making.   EKG Interpretation None      MDM   Final diagnoses:  URI (upper respiratory infection)  Bronchitis    Patient presents with upper respiratory symptoms. History of asthma. Not currently wheezing but his artery received a DuoNeb. Does not have an inhaler. Inhaler ordered. Patient given prednisone. Strep screen obtained and is negative. Suspect URI given constellations of symptoms. Patient is nontoxic-appearing and vital signs are reassuring. Discussed with patient supportive care at home including inhaler, prednisone, nasal saline, and over-the-counter decongestants. Patient stated understanding.  After history, exam, and medical workup I feel the patient has been appropriately medically screened and is safe for discharge home. Pertinent diagnoses were discussed with the patient. Patient was given return precautions.  I personally performed the services described in this documentation, which was scribed in my presence. The recorded information has been reviewed and  is accurate.   Shon Batonourtney F Horton, MD 07/08/15 [phone]

## 2015-07-07 NOTE — ED Notes (Signed)
Dr. Horton at bedside. 

## 2015-07-08 LAB — RAPID STREP SCREEN (MED CTR MEBANE ONLY): STREPTOCOCCUS, GROUP A SCREEN (DIRECT): NEGATIVE

## 2015-07-08 MED ORDER — PREDNISONE 20 MG PO TABS: 60.0000 mg | ORAL_TABLET | Freq: Every day | ORAL | Status: DC

## 2015-07-08 MED ORDER — SALINE SPRAY 0.65 % NA SOLN: 1.0000 | NASAL | Status: AC | PRN

## 2015-07-08 MED ORDER — ALBUTEROL SULFATE HFA 108 (90 BASE) MCG/ACT IN AERS: 2.0000 | INHALATION_SPRAY | RESPIRATORY_TRACT | Status: AC | PRN

## 2015-07-08 NOTE — Discharge Instructions (Signed)
Upper Respiratory Infection, Adult Most upper respiratory infections (URIs) are a viral infection of the air passages leading to the lungs. A URI affects the nose, throat, and upper air passages. The most common type of URI is nasopharyngitis and is typically referred to as "the common cold." URIs run their course and usually go away on their own. Most of the time, a URI does not require medical attention, but sometimes a bacterial infection in the upper airways can follow a viral infection. This is called a secondary infection. Sinus and middle ear infections are common types of secondary upper respiratory infections. Bacterial pneumonia can also complicate a URI. A URI can worsen asthma and chronic obstructive pulmonary disease (COPD). Sometimes, these complications can require emergency medical care and may be life threatening.  CAUSES Almost all URIs are caused by viruses. A virus is a type of germ and can spread from one person to another.  RISKS FACTORS You may be at risk for a URI if:   You smoke.   You have chronic heart or lung disease.  You have a weakened defense (immune) system.   You are very young or very old.   You have nasal allergies or asthma.  You work in crowded or poorly ventilated areas.  You work in health care facilities or schools. SIGNS AND SYMPTOMS  Symptoms typically develop 2-3 days after you come in contact with a cold virus. Most viral URIs last 7-10 days. However, viral URIs from the influenza virus (flu virus) can last 14-18 days and are typically more severe. Symptoms may include:   Runny or stuffy (congested) nose.   Sneezing.   Cough.   Sore throat.   Headache.   Fatigue.   Fever.   Loss of appetite.   Pain in your forehead, behind your eyes, and over your cheekbones (sinus pain).  Muscle aches.  DIAGNOSIS  Your health care provider may diagnose a URI by:  Physical exam.  Tests to check that your symptoms are not due to  another condition such as:  Strep throat.  Sinusitis.  Pneumonia.  Asthma. TREATMENT  A URI goes away on its own with time. It cannot be cured with medicines, but medicines may be prescribed or recommended to relieve symptoms. Medicines may help:  Reduce your fever.  Reduce your cough.  Relieve nasal congestion. HOME CARE INSTRUCTIONS   Take medicines only as directed by your health care provider.   Gargle warm saltwater or take cough drops to comfort your throat as directed by your health care provider.  Use a warm mist humidifier or inhale steam from a shower to increase air moisture. This may make it easier to breathe.  Drink enough fluid to keep your urine clear or pale yellow.   Eat soups and other clear broths and maintain good nutrition.   Rest as needed.   Return to work when your temperature has returned to normal or as your health care provider advises. You may need to stay home longer to avoid infecting others. You can also use a face mask and careful hand washing to prevent spread of the virus.  Increase the usage of your inhaler if you have asthma.   Do not use any tobacco products, including cigarettes, chewing tobacco, or electronic cigarettes. If you need help quitting, ask your health care provider. PREVENTION  The best way to protect yourself from getting a cold is to practice good hygiene.   Avoid oral or hand contact with people with cold   symptoms.   Wash your hands often if contact occurs.  There is no clear evidence that vitamin C, vitamin E, echinacea, or exercise reduces the chance of developing a cold. However, it is always recommended to get plenty of rest, exercise, and practice good nutrition.  SEEK MEDICAL CARE IF:   You are getting worse rather than better.   Your symptoms are not controlled by medicine.   You have chills.  You have worsening shortness of breath.  You have brown or red mucus.  You have yellow or brown nasal  discharge.  You have pain in your face, especially when you bend forward.  You have a fever.  You have swollen neck glands.  You have pain while swallowing.  You have white areas in the back of your throat. SEEK IMMEDIATE MEDICAL CARE IF:   You have severe or persistent:  Headache.  Ear pain.  Sinus pain.  Chest pain.  You have chronic lung disease and any of the following:  Wheezing.  Prolonged cough.  Coughing up blood.  A change in your usual mucus.  You have a stiff neck.  You have changes in your:  Vision.  Hearing.  Thinking.  Mood. MAKE SURE YOU:   Understand these instructions.  Will watch your condition.  Will get help right away if you are not doing well or get worse.   This information is not intended to replace advice given to you by your health care provider. Make sure you discuss any questions you have with your health care provider.   Document Released: 01/11/2001 Document Revised: 12/02/2014 Document Reviewed: 10/23/2013 Elsevier Interactive Patient Education 2016 Elsevier Inc.  

## 2015-07-10 LAB — CULTURE, GROUP A STREP: STREP A CULTURE: NEGATIVE

## 2022-10-24 ENCOUNTER — Ambulatory Visit (HOSPITAL_COMMUNITY)
Admission: EM | Admit: 2022-10-24 | Discharge: 2022-10-24 | Disposition: A | Discharge status: Home / Self Care | Payer: Medicaid Other | Attending: Psychiatry | Admitting: Psychiatry

## 2022-10-24 DIAGNOSIS — Z630 Problems in relationship with spouse or partner: Secondary | ICD-10-CM | POA: Insufficient documentation

## 2022-10-24 DIAGNOSIS — F32A Depression, unspecified: Secondary | ICD-10-CM | POA: Insufficient documentation

## 2022-10-24 DIAGNOSIS — R45851 Suicidal ideations: Secondary | ICD-10-CM | POA: Insufficient documentation

## 2022-10-24 DIAGNOSIS — Z638 Other specified problems related to primary support group: Secondary | ICD-10-CM

## 2022-10-24 DIAGNOSIS — F4310 Post-traumatic stress disorder, unspecified: Secondary | ICD-10-CM | POA: Insufficient documentation
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
## 2022-10-24 NOTE — Discharge Instructions (Signed)

## 2023-04-10 ENCOUNTER — Emergency Department (HOSPITAL_COMMUNITY)
Admission: EM | Admit: 2023-04-10 | Discharge: 2023-04-10 | Disposition: A | Discharge status: Home / Self Care | Payer: MEDICAID | Attending: Emergency Medicine | Admitting: Emergency Medicine

## 2023-04-10 ENCOUNTER — Ambulatory Visit (HOSPITAL_COMMUNITY)
Admission: EM | Admit: 2023-04-10 | Discharge: 2023-04-10 | Disposition: A | Discharge status: Home / Self Care | Payer: MEDICAID | Attending: Nurse Practitioner | Admitting: Nurse Practitioner

## 2023-04-10 ENCOUNTER — Encounter (HOSPITAL_COMMUNITY): Payer: Self-pay

## 2023-04-10 ENCOUNTER — Emergency Department (HOSPITAL_COMMUNITY): Payer: MEDICAID

## 2023-04-10 ENCOUNTER — Other Ambulatory Visit: Payer: Self-pay

## 2023-04-10 DIAGNOSIS — R0902 Hypoxemia: Secondary | ICD-10-CM

## 2023-04-10 DIAGNOSIS — J4541 Moderate persistent asthma with (acute) exacerbation: Secondary | ICD-10-CM | POA: Diagnosis not present

## 2023-04-10 DIAGNOSIS — Z1152 Encounter for screening for COVID-19: Secondary | ICD-10-CM | POA: Insufficient documentation

## 2023-04-10 DIAGNOSIS — R0602 Shortness of breath: Secondary | ICD-10-CM | POA: Diagnosis present

## 2023-04-10 DIAGNOSIS — J4551 Severe persistent asthma with (acute) exacerbation: Secondary | ICD-10-CM

## 2023-04-10 DIAGNOSIS — R0682 Tachypnea, not elsewhere classified: Secondary | ICD-10-CM

## 2023-04-10 LAB — BASIC METABOLIC PANEL
Anion gap: 10 (ref 5–15)
BUN: 8 mg/dL (ref 6–20)
CO2: 27 mmol/L (ref 22–32)
Calcium: 9.2 mg/dL (ref 8.9–10.3)
Chloride: 99 mmol/L (ref 98–111)
Creatinine, Ser: 1.01 mg/dL (ref 0.61–1.24)
GFR, Estimated: >60 mL/min (ref >60)
Glucose, Bld: 104 mg/dL — ABNORMAL HIGH (ref 70–99)
Potassium: 4.3 mmol/L (ref 3.5–5.1)
Sodium: 136 mmol/L (ref 135–145)

## 2023-04-10 LAB — CBC WITH DIFFERENTIAL/PLATELET
Abs Immature Granulocytes: 0.03 10*3/uL (ref 0.00–0.07)
Basophils Absolute: 0.1 10*3/uL (ref 0.0–0.1)
Basophils Relative: 1 %
Eosinophils Absolute: 0.4 10*3/uL (ref 0.0–0.5)
Eosinophils Relative: 5 %
HCT: 37.4 % — ABNORMAL LOW (ref 39.0–52.0)
Hemoglobin: 12.4 g/dL — ABNORMAL LOW (ref 13.0–17.0)
Immature Granulocytes: 0 %
Lymphocytes Relative: 29 %
Lymphs Abs: 2.4 10*3/uL (ref 0.7–4.0)
MCH: 28.4 pg (ref 26.0–34.0)
MCHC: 33.2 g/dL (ref 30.0–36.0)
MCV: 85.8 fL (ref 80.0–100.0)
Monocytes Absolute: 0.5 10*3/uL (ref 0.1–1.0)
Monocytes Relative: 7 %
Neutro Abs: 4.8 10*3/uL (ref 1.7–7.7)
Neutrophils Relative %: 58 %
Platelets: 329 10*3/uL (ref 150–400)
RBC: 4.36 MIL/uL (ref 4.22–5.81)
RDW: 12.5 % (ref 11.5–15.5)
WBC: 8.3 10*3/uL (ref 4.0–10.5)
nRBC: 0 % (ref 0.0–0.2)

## 2023-04-10 LAB — SARS CORONAVIRUS 2 BY RT PCR: SARS Coronavirus 2 by RT PCR: NEGATIVE

## 2023-04-10 MED ORDER — IPRATROPIUM-ALBUTEROL 0.5-2.5 (3) MG/3ML IN SOLN
3.0000 mL | Freq: Once | RESPIRATORY_TRACT | Status: AC
  Administered 2023-04-10: 3 mL via RESPIRATORY_TRACT

## 2023-04-10 MED ORDER — PREDNISONE 20 MG PO TABS: ORAL_TABLET | ORAL | 0 refills | Status: AC

## 2023-04-10 MED ORDER — ALBUTEROL SULFATE (2.5 MG/3ML) 0.083% IN NEBU
INHALATION_SOLUTION | RESPIRATORY_TRACT | Status: AC
  Filled 2023-04-10: qty 3

## 2023-04-10 MED ORDER — MAGNESIUM SULFATE 2 GM/50ML IV SOLN
2.0000 g | Freq: Once | INTRAVENOUS | Status: AC
  Administered 2023-04-10: 2 g via INTRAVENOUS
  Filled 2023-04-10: qty 50

## 2023-04-10 MED ORDER — METHYLPREDNISOLONE ACETATE 40 MG/ML IJ SUSP
40.0000 mg | Freq: Once | INTRAMUSCULAR | Status: AC
  Administered 2023-04-10: 40 mg via INTRAMUSCULAR

## 2023-04-10 MED ORDER — IPRATROPIUM-ALBUTEROL 0.5-2.5 (3) MG/3ML IN SOLN
3.0000 mL | RESPIRATORY_TRACT | Status: AC
  Administered 2023-04-10 (×3): 3 mL via RESPIRATORY_TRACT
  Filled 2023-04-10: qty 3
  Filled 2023-04-10: qty 9

## 2023-04-10 MED ORDER — METHYLPREDNISOLONE ACETATE 40 MG/ML IJ SUSP
INTRAMUSCULAR | Status: AC
  Filled 2023-04-10: qty 1

## 2023-04-10 MED ORDER — IPRATROPIUM-ALBUTEROL 0.5-2.5 (3) MG/3ML IN SOLN
RESPIRATORY_TRACT | Status: AC
  Filled 2023-04-10: qty 3

## 2023-04-10 NOTE — Discharge Instructions (Addendum)
Use your inhaler every 4 hours(6 puffs) while awake, return for sudden worsening shortness of breath, or if you need to use your inhaler more often.  ° °

## 2023-04-10 NOTE — ED Triage Notes (Signed)
Pt is here for asthma flare-up x 3 days. Pt last use inhaler 2-3 hours ago.

## 2023-04-10 NOTE — ED Notes (Signed)
MD at bedside with PT

## 2023-04-10 NOTE — ED Provider Triage Note (Signed)
Emergency Medicine Provider Triage Evaluation Note  JAIDEEP RUDY , a 37 y.o. male  was evaluated in triage.  Pt complains of shortness of breath for the past few days.  Patient was seen over at [facility] and sent for asthma exacerbation.  Patient was giving breathing treatment and steroid shot.  He continues to be tachypneic with chest tightness.  He had SPO2 of 90-91% at [facility].  Patient has been using albuterol inhaler and nebulizer without relief of symptoms at home.   Review of Systems  Positive: As above Negative: As above  Physical Exam  BP (!) 146/106   Pulse 68   Temp 98.1 F (36.7 C) (Oral)   Resp (!) 24   Ht 6\' 2"  (1.88 m)   Wt 102.1 kg   SpO2 98%   BMI 28.90 kg/m  Gen:   Awake, no distress   Resp:  Tachypneic with decreased breath sounds and expiratory wheezing in all lung fields. Patient speaks in broken sentences.  MSK:   Moves extremities without difficulty  Other:    Medical Decision Making  Medically screening exam initiated at 6:50 PM.  Appropriate orders placed.  JACOBSON MILAN was informed that the remainder of the evaluation will be completed by another provider, this initial triage assessment does not replace that evaluation, and the importance of remaining in the ED until their evaluation is complete.  SPO2 initially 91%, improved to 98% with deep breathing.     Lenard Simmer, [place] 04/10/23 [phone]

## 2023-04-10 NOTE — ED Provider Notes (Signed)
[place] [facility]PARTMENT AT [facility] Provider Note   CSN: [id number] Arrival date & time: 04/10/23  1843     History  Chief Complaint  Patient presents with   Shortness of Breath    Peter Bailey is a 37 y.o. male.  37 yo M with a chief complaint of difficulty breathing.  Going on for about 72 hours.  Has a history of asthma though seems to only occur when he is sick with an upper respiratory illness.  No known sick contacts.  Is having some purulent sputum.  Tried his albuterol at home without improvement.  Went to urgent care today and they were concerned about his work of breathing that did not significantly improve with a DuoNeb and sent him here for evaluation.   Shortness of Breath      Home Medications Prior to Admission medications   Medication Sig Start Date End Date Taking? Authorizing Provider  predniSONE (DELTASONE) 20 MG tablet 2 tabs po daily x 4 days 04/10/23  Yes Melene Plan, DO  albuterol (PROVENTIL HFA;VENTOLIN HFA) 108 (90 BASE) MCG/ACT inhaler Inhale 2 puffs into the lungs every 4 (four) hours as needed for wheezing or shortness of breath. 07/08/15   Horton, Mayer Masker, MD  bacitracin ointment Apply 1 application topically 2 (two) times daily.    [provider]  diphenhydramine-acetaminophen (TYLENOL PM) 25-500 MG TABS Take 2 tablets by mouth at bedtime as needed (pain).    [provider]  fluticasone (FLOVENT HFA) 220 MCG/ACT inhaler Inhale 2 puffs into the lungs 2 (two) times daily. 08/27/14   Nestor Ramp, MD  Multiple Vitamin (MULTIVITAMIN WITH MINERALS) TABS tablet Take 1 tablet by mouth daily.    [provider]  sodium chloride (OCEAN) 0.65 % SOLN nasal spray Place 1 spray into both nostrils as needed for congestion. 07/08/15   Horton, Mayer Masker, MD  diazepam (VALIUM) 5 MG tablet Take 1 tablet (5 mg total) by mouth every 8 (eight) hours as needed for anxiety or sleep. 10/01/10 10/01/10  Mayo Ao, NP       Allergies    Ibuprofen and Pork-derived products    Review of Systems   Review of Systems  Respiratory:  Positive for shortness of breath.     Physical Exam Updated Vital Signs BP (!) 146/106   Pulse 68   Temp 98.1 F (36.7 C) (Oral)   Resp (!) 24   Ht 6\' 2"  (1.88 m)   Wt 102.1 kg   SpO2 100%   BMI 28.90 kg/m  Physical Exam Vitals and nursing note reviewed.  Constitutional:      Appearance: He is well-developed.  HENT:     Head: Normocephalic and atraumatic.  Eyes:     Pupils: Pupils are equal, round, and reactive to light.  Neck:     Vascular: No JVD.  Cardiovascular:     Rate and Rhythm: Normal rate and regular rhythm.     Heart sounds: No murmur heard.    No friction rub. No gallop.  Pulmonary:     Effort: No respiratory distress.     Breath sounds: Wheezing present.     Comments: Diminished breath sounds in all fields with prolonged expiratory effort and end expiratory wheezes. Abdominal:     General: There is no distension.     Tenderness: There is no abdominal tenderness. There is no guarding or rebound.  Musculoskeletal:        General: Normal range of  motion.     Cervical back: Normal range of motion and neck supple.  Skin:    Coloration: Skin is not pale.     Findings: No rash.  Neurological:     Mental Status: He is alert and oriented to person, place, and time.  Psychiatric:        Behavior: Behavior normal.     ED Results / Procedures / Treatments   Labs (all labs ordered are listed, but only abnormal results are displayed) Labs Reviewed  BASIC METABOLIC PANEL - Abnormal; Notable for the following components:      Result Value   Glucose, Bld 104 (*)    All other components within normal limits  CBC WITH DIFFERENTIAL/PLATELET - Abnormal; Notable for the following components:   Hemoglobin 12.4 (*)    HCT 37.4 (*)    All other components within normal limits  SARS CORONAVIRUS 2 BY RT PCR    EKG EKG Interpretation Date/Time:  Monday  April 10 2023 18:54:48 EDT Ventricular Rate:  74 PR Interval:  138 QRS Duration:  88 QT Interval:  400 QTC Calculation: 444 R Axis:   59  Text Interpretation: Normal sinus rhythm Normal ECG No significant change since last tracing Confirmed by Melene Plan [phone]) on 04/10/2023 7:41:22 PM  Radiology No results found.  Procedures Procedures    Medications Ordered in ED Medications  ipratropium-albuterol (DUONEB) 0.5-2.5 (3) MG/3ML nebulizer solution 3 mL (3 mLs Nebulization Given 04/10/23 1944)  magnesium sulfate IVPB 2 g 50 mL (0 g Intravenous Stopped 04/10/23 2021)    ED Course/ Medical Decision Making/ A&P                                 Medical Decision Making Amount and/or Complexity of Data Reviewed Radiology: ordered.  Risk Prescription drug management.   37 yo M with a chief complaints of difficulty breathing.  Going on for about 72 hours.  Cough congestion.  No reported fevers no known sick contacts.  Patient lung exam consistent with asthma.  He tells me he does feel quite a bit better after neb at urgent care.  Will give 3 DuoNebs back-to-back here.  Magnesium.  He had injection steroids at urgent care.  Patient is feeling much better after 3 DuoNebs back-to-back and magnesium.  Has much better aeration on my repeat lung exam.  No significant anemia, no significant electrolyte abnormalities.  Chest x-ray independently interpreted by me without focal infiltrate or pneumothorax.  8:49 PM:  I have discussed the diagnosis/risks/treatment options with the patient.  Evaluation and diagnostic testing in the emergency department does not suggest an emergent condition requiring admission or immediate intervention beyond what has been performed at this time.  They will follow up with PCP. We also discussed returning to the ED immediately if new or worsening sx occur. We discussed the sx which are most concerning (e.g., sudden worsening pain, fever, inability to tolerate by mouth,  worsening sob, need to use inhaler more often than every 4 hours) that necessitate immediate return. Medications administered to the patient during their visit and any new prescriptions provided to the patient are listed below.  Medications given during this visit Medications  ipratropium-albuterol (DUONEB) 0.5-2.5 (3) MG/3ML nebulizer solution 3 mL (3 mLs Nebulization Given 04/10/23 1944)  magnesium sulfate IVPB 2 g 50 mL (0 g Intravenous Stopped 04/10/23 2021)     The patient appears reasonably screen and/or stabilized for  discharge and I doubt any other medical condition or other [facility] requiring further screening, evaluation, or treatment in the ED at this time prior to discharge.           Final Clinical Impression(s) / ED Diagnoses Final diagnoses:  Moderate persistent asthma with exacerbation    Rx / DC Orders ED Discharge Orders          Ordered    predniSONE (DELTASONE) 20 MG tablet        04/10/23 2048              Melene Plan, DO 04/10/23 2049

## 2023-04-10 NOTE — Discharge Instructions (Signed)
Please go straight to the ER for further evaluation and management of your breathing

## 2023-04-10 NOTE — ED Notes (Signed)
PT eating food in the bed, A&Ox4. PT was given urinal.

## 2023-04-10 NOTE — ED Triage Notes (Addendum)
Pt c/o SOBx3d. Pt has audible wheezes. Pt is tachypneic and diaphretic

## 2023-04-10 NOTE — ED Provider Notes (Signed)
[facility]    CSN: [id number] Arrival date & time: 04/10/23  1741      History   Chief Complaint Chief Complaint  Patient presents with   Asthma    HPI Peter Bailey is a 37 y.o. male.   Patient presents today with 5-day history of bodyaches and chills, congested cough, shortness of breath and wheezing, chest tightness, headache, decreased appetite, and fatigue.  He denies known fevers, runny or stuffy nose, sore throat, ear pain, abdominal pain, nausea/vomiting, diarrhea.  Reports his breathing acutely worsened today, he has been using albuterol inhaler more than normal and he does not feel it is helping very much.  Reports last albuterol use was immediately prior to being pulled back into a room in urgent care.  Has also been taking NyQuil for symptoms without much improvement.  Reports history of asthma.    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   Atypical pneumonia 04/27/2015   Positive PPD    Acute pharyngitis    Opiate withdrawal (HCC)    Asthma exacerbation 02/16/2014   URI (upper respiratory infection) 05/30/2011   Left ankle sprain 03/22/2011   Low back pain 03/22/2011   Asthma 04/19/2010   TOBACCO USER 05/09/2009   Bipolar disorder (HCC) 04/05/2007    Past Surgical History:  Procedure Laterality Date   APPENDECTOMY     MANDIBLE SURGERY     ORIF FINGER FRACTURE  12/02/2011   Procedure: OPEN REDUCTION INTERNAL FIXATION (ORIF) METACARPAL (FINGER) FRACTURE;  Surgeon: Marlowe Shores, MD;  Location: [facility] OR;  Service: Orthopedics;  Laterality: Right;  right ring metatarsal       Home Medications    Prior to Admission medications   Medication Sig Start Date End Date Taking? Authorizing Provider  albuterol  (PROVENTIL HFA;VENTOLIN HFA) 108 (90 BASE) MCG/ACT inhaler Inhale 2 puffs into the lungs every 4 (four) hours as needed for wheezing or shortness of breath. 07/08/15  Yes Horton, Mayer Masker, MD  bacitracin ointment Apply 1 application topically 2 (two) times daily.    [provider]  diphenhydramine-acetaminophen (TYLENOL PM) 25-500 MG TABS Take 2 tablets by mouth at bedtime as needed (pain).    [provider]  fluticasone (FLOVENT HFA) 220 MCG/ACT inhaler Inhale 2 puffs into the lungs 2 (two) times daily. 08/27/14   Nestor Ramp, MD  Multiple Vitamin (MULTIVITAMIN WITH MINERALS) TABS tablet Take 1 tablet by mouth daily.    [provider]  predniSONE (DELTASONE) 20 MG tablet Take 3 tablets (60 mg total) by mouth daily with breakfast. 07/08/15   Horton, Mayer Masker, MD  sodium chloride (OCEAN) 0.65 % SOLN nasal spray Place 1 spray into both nostrils as needed for congestion. 07/08/15   Horton, Mayer Masker, MD  diazepam (VALIUM) 5 MG tablet Take 1 tablet (5 mg total) by mouth every 8 (eight) hours as needed for anxiety or sleep. 10/01/10 10/01/10  Mayo Ao, NP    Family History Family History  Problem Relation Age of Onset   Anesthesia problems Neg Hx    Hypotension Neg Hx    Malignant hyperthermia Neg Hx    Pseudochol deficiency Neg Hx     Social History Social History  Tobacco Use   Smoking status: Every Day    Current packs/day: 0.00    Types: Cigarettes   Smokeless tobacco: Never  Substance Use Topics   Alcohol use: No   Drug use: No     Allergies   Ibuprofen and Pork-derived products   Review of Systems Review of Systems Per HPI  Physical Exam Triage Vital Signs ED Triage Vitals [04/10/23 1748]  Encounter Vitals Group     BP 128/81     Systolic BP Percentile      Diastolic BP Percentile      Pulse Rate 83     Resp 16     Temp 98.1 F (36.7 C)     Temp src      SpO2 91 %     Weight      Height      Head Circumference      Peak Flow       Pain Score      Pain Loc      Pain Education      Exclude from Growth Chart    No data found.  Updated Vital Signs BP 128/81 (BP Location: Left Arm)   Pulse 83   Temp 98.1 F (36.7 C)   Resp 16   SpO2 93%   Visual Acuity Right Eye Distance:   Left Eye Distance:   Bilateral Distance:    Right Eye Near:   Left Eye Near:    Bilateral Near:     Physical Exam Vitals and nursing note reviewed.  Constitutional:      General: He is not in acute distress.    Appearance: Normal appearance. He is not ill-appearing or toxic-appearing.  HENT:     Head: Normocephalic and atraumatic.     Right Ear: Tympanic membrane, ear canal and external ear normal.     Left Ear: Tympanic membrane, ear canal and external ear normal.     Nose: No congestion or rhinorrhea.     Mouth/Throat:     Mouth: Mucous membranes are moist.     Pharynx: Oropharynx is clear. No oropharyngeal exudate or posterior oropharyngeal erythema.  Eyes:     General: No scleral icterus.    Extraocular Movements: Extraocular movements intact.  Cardiovascular:     Rate and Rhythm: Normal rate and regular rhythm.  Pulmonary:     Effort: Pulmonary effort is normal. Tachypnea present. No respiratory distress.     Breath sounds: Decreased air movement present. Wheezing present. No rhonchi or rales.  Abdominal:     General: Abdomen is flat. Bowel sounds are normal. There is no distension.     Palpations: Abdomen is soft.  Musculoskeletal:     Cervical back: Normal range of motion and neck supple.  Lymphadenopathy:     Cervical: No cervical adenopathy.  Skin:    General: Skin is warm and dry.     Coloration: Skin is not jaundiced or pale.     Findings: No erythema or rash.  Neurological:     Mental Status: He is alert and oriented to person, place, and time.  Psychiatric:        Behavior: Behavior is cooperative.      UC Treatments / Results  Labs (all labs ordered are listed, but only abnormal results are  displayed) Labs Reviewed - No data to display  EKG   Radiology No results found.  Procedures Procedures (including critical care time)  Medications Ordered in UC Medications  methylPREDNISolone acetate (DEPO-MEDROL)  injection 40 mg (40 mg Intramuscular Given 04/10/23 1754)  ipratropium-albuterol (DUONEB) 0.5-2.5 (3) MG/3ML nebulizer solution 3 mL (3 mLs Nebulization Given 04/10/23 1754)    Initial Impression / Assessment and Plan / UC Course  I have reviewed the triage vital signs and the nursing notes.  Pertinent labs & imaging results that were available during my care of the patient were reviewed by me and considered in my medical decision making (see chart for    1. Severe persistent asthma with exacerbation 2. Tachypnea 3. Hypoxia In triage, patient is ill-appearing, tachypneic, and SpO2 is 90% on room air initially DuoNeb given and Depo-Medrol 40 mg IM given Patient had subjective improvement in chest tightness, however upon my reentering the room, oxygen saturation 85% on room air; after a few deep breaths, O2 improved to 92% on room air He continued to have inspiratory and expiratory wheezes, decreased lung sounds of the lower lobes, so recommended further evaluation and management in the emergency room Patient stable to transport via private vehicle, reports he will go immediately to the emergency room from urgent care today  The patient was given the opportunity to ask questions.  All questions answered to their satisfaction.  The patient is in agreement to this plan.    Final Clinical Impressions(s) / UC Diagnoses   Final diagnoses:  Severe persistent asthma with exacerbation  Tachypnea  Hypoxia     Discharge Instructions      Please go straight to the ER for further evaluation and management of your breathing     ED Prescriptions   None    PDMP not reviewed this encounter.   Valentino Nose, NP 04/10/23 [phone]
